# Patient Record
Sex: Male | Born: 1950 | Race: White | Hispanic: No | Marital: Married | Smoking: Former smoker
Health system: Southern US, Community
[De-identification: ages and names within clinical notes are randomized; demographics above are authoritative.]

## PROBLEM LIST (undated history)

## (undated) DIAGNOSIS — S8291XA Unspecified fracture of right lower leg, initial encounter for closed fracture: Secondary | ICD-10-CM

## (undated) DIAGNOSIS — C801 Malignant (primary) neoplasm, unspecified: Secondary | ICD-10-CM

## (undated) DIAGNOSIS — I5189 Other ill-defined heart diseases: Secondary | ICD-10-CM

## (undated) DIAGNOSIS — I10 Essential (primary) hypertension: Secondary | ICD-10-CM

## (undated) DIAGNOSIS — H9190 Unspecified hearing loss, unspecified ear: Secondary | ICD-10-CM

## (undated) DIAGNOSIS — G4733 Obstructive sleep apnea (adult) (pediatric): Secondary | ICD-10-CM

## (undated) DIAGNOSIS — C61 Malignant neoplasm of prostate: Secondary | ICD-10-CM

## (undated) DIAGNOSIS — R7301 Impaired fasting glucose: Secondary | ICD-10-CM

## (undated) DIAGNOSIS — I77810 Thoracic aortic ectasia: Secondary | ICD-10-CM

## (undated) DIAGNOSIS — Z8619 Personal history of other infectious and parasitic diseases: Secondary | ICD-10-CM

## (undated) DIAGNOSIS — Z85828 Personal history of other malignant neoplasm of skin: Secondary | ICD-10-CM

## (undated) DIAGNOSIS — R5383 Other fatigue: Secondary | ICD-10-CM

## (undated) DIAGNOSIS — N400 Enlarged prostate without lower urinary tract symptoms: Secondary | ICD-10-CM

## (undated) DIAGNOSIS — Z9289 Personal history of other medical treatment: Secondary | ICD-10-CM

## (undated) DIAGNOSIS — S2232XA Fracture of one rib, left side, initial encounter for closed fracture: Secondary | ICD-10-CM

## (undated) HISTORY — DX: Malignant neoplasm of prostate: C61

## (undated) HISTORY — DX: Unspecified fracture of right lower leg, initial encounter for closed fracture: S82.91XA

## (undated) HISTORY — DX: Other ill-defined heart diseases: I51.89

## (undated) HISTORY — DX: Personal history of other malignant neoplasm of skin: Z85.828

## (undated) HISTORY — DX: Personal history of other infectious and parasitic diseases: Z86.19

## (undated) HISTORY — PX: OTHER SURGICAL HISTORY: SHX169

## (undated) HISTORY — DX: Thoracic aortic ectasia: I77.810

## (undated) HISTORY — DX: Benign prostatic hyperplasia without lower urinary tract symptoms: N40.0

## (undated) HISTORY — DX: Unspecified hearing loss, unspecified ear: H91.90

## (undated) HISTORY — DX: Fracture of one rib, left side, initial encounter for closed fracture: S22.32XA

## (undated) HISTORY — DX: Obstructive sleep apnea (adult) (pediatric): G47.33

## (undated) HISTORY — DX: Other fatigue: R53.83

## (undated) HISTORY — DX: Personal history of other medical treatment: Z92.89

## (undated) HISTORY — PX: TONSILLECTOMY AND ADENOIDECTOMY: SUR1326

## (undated) HISTORY — DX: Malignant (primary) neoplasm, unspecified: C80.1

## (undated) HISTORY — DX: Impaired fasting glucose: R73.01

## (undated) HISTORY — DX: Essential (primary) hypertension: I10

---

## 2007-01-02 ENCOUNTER — Ambulatory Visit: Payer: Self-pay | Admitting: Internal Medicine

## 2007-01-14 ENCOUNTER — Ambulatory Visit: Payer: Self-pay | Admitting: Internal Medicine

## 2007-03-08 ENCOUNTER — Ambulatory Visit (HOSPITAL_COMMUNITY): Admission: RE | Admit: 2007-03-08 | Discharge: 2007-03-08 | Payer: Self-pay | Admitting: Urology

## 2007-03-19 ENCOUNTER — Encounter: Admission: RE | Admit: 2007-03-19 | Discharge: 2007-03-19 | Payer: Self-pay | Admitting: Urology

## 2011-01-08 ENCOUNTER — Encounter: Payer: Self-pay | Admitting: Otolaryngology

## 2012-01-12 ENCOUNTER — Encounter: Payer: Self-pay | Admitting: Internal Medicine

## 2012-09-23 ENCOUNTER — Encounter: Payer: Self-pay | Admitting: Internal Medicine

## 2013-06-18 ENCOUNTER — Other Ambulatory Visit: Payer: Self-pay | Admitting: Dermatology

## 2014-02-25 ENCOUNTER — Ambulatory Visit (INDEPENDENT_AMBULATORY_CARE_PROVIDER_SITE_OTHER): Payer: BC Managed Care – PPO

## 2014-02-25 VITALS — BP 112/75 | HR 84 | Resp 18

## 2014-02-25 DIAGNOSIS — G5760 Lesion of plantar nerve, unspecified lower limb: Secondary | ICD-10-CM

## 2014-02-25 DIAGNOSIS — M79609 Pain in unspecified limb: Secondary | ICD-10-CM

## 2014-02-25 DIAGNOSIS — M778 Other enthesopathies, not elsewhere classified: Secondary | ICD-10-CM

## 2014-02-25 DIAGNOSIS — M7750 Other enthesopathy of unspecified foot: Secondary | ICD-10-CM

## 2014-02-25 DIAGNOSIS — M779 Enthesopathy, unspecified: Secondary | ICD-10-CM

## 2014-02-25 DIAGNOSIS — B351 Tinea unguium: Secondary | ICD-10-CM

## 2014-02-25 MED ORDER — EFINACONAZOLE 10 % EX SOLN: CUTANEOUS | Status: DC

## 2014-02-25 NOTE — Patient Instructions (Signed)
ICE INSTRUCTIONS  Apply ice or cold pack to the affected area at least 3 times a day for 10-15 minutes each time.  You should also use ice after prolonged activity or vigorous exercise.  Do not apply ice longer than 20 minutes at one time.  Always keep a cloth between your skin and the ice pack to prevent burns.  Being consistent and following these instructions will help control your symptoms.  We suggest you purchase a gel ice pack because they are reusable and do bit leak.  Some of them are designed to wrap around the area.  Use the method that works best for you.  Here are some other suggestions for icing.   Use a frozen bag of peas or corn-inexpensive and molds well to your body, usually stays frozen for 10 to 20 minutes.  Wet a towel with cold water and squeeze out the excess until it's damp.  Place in a bag in the freezer for 20 minutes. Then remove and use.  Recommend wider looser lace up shoes for the next several weeks. For any severe pain tried Tylenol or ibuprofen over-the-counter as needed For walking activities start slow and maintain a level smooth surface avoid any holes inclines  For fungus nails apply the topical antifungal once daily to each affected toe one drop toe daily for 12 month

## 2014-02-25 NOTE — Progress Notes (Signed)
   Subjective:    Patient ID: Jeremiah Holt, male    DOB: 1951/02/09, 62 y.o.   MRN: [id number]  HPI there is a place on the ball of my left foot that has been going for about 3 weeks and feels like it ought not to be there and irritated    Review of Systems  Respiratory: Positive for cough.   Allergic/Immunologic: Positive for environmental allergies.  All other systems reviewed and are negative.       Objective:   Physical Exam Lower extremity objective findings as follows. Pedal pulses palpable DP and PT +2/4 bilateral capillary refill time 3 seconds all digits skin temperature warm turgor normal no edema rubor pallor or varicosities noted. Neurologically epicritic and proprioceptive sensations appear to be intact and symmetric bilateral. There is normal plantar response DTRs not listed. Dermatologically skin color pigment normal hair growth absent although diminished distally nail somewhat criptotic friable yellow hallux and second third digits show yellowing and discoloration and friability the nail plate consistent with onychomycosis. There is no interdigital fissuring no maceration noted. Orthopedic biomechanical exam reveals rectus foot type mild digital contractures noted adductovarus rotation lesser digits 4-5 bilateral hallux appears to be rectus good range of motion all digits is noted. There is no reproducible pain or symptomology however patient may have some slight abnormal sensation along the second third and fourth toes second third interspaces left foot. Patient describes a full type sensation or feeling as if you standing on something to it's not constant. No x-rays taken at this time no pain on vibratory tuning fork application no signs of fracture or any other osseous abnormalities no cyst or tumors  suspect at this time.       Assessment & Plan:  Assessment #1 early Morton's neuroma symptomology left foot second and third interspace area. And wider Oxford shoes and lace  up shoes avoid entire constrictive or curved last also suggested Tylenol or Advil as needed for pain and ice to the heat to the foot area once or twice every day or in the evening before going to bed. Also maintain level surfaces when the ballistic activities as returns to walking and exercising.  Assessment #2 onychomycosis of nails hallux second third digits bilateral show thickening dystrophy discoloration and friability patient was to avoid oral and medications do to a liver toxicity is in stable tried topical Jublia antifungal which is called in to allow the pharmacy will be mailed to him patient will initiate use for daily instruction is to apply 1 drop to the affected toes each day for 12 months duration. Suggest a 6-12 month followup for the nails if the neuroma symptomology has improved we'll followup with in the next month as needed  Harriet Masson DPM

## 2014-02-26 ENCOUNTER — Other Ambulatory Visit (HOSPITAL_COMMUNITY): Payer: Self-pay | Admitting: Internal Medicine

## 2014-02-26 ENCOUNTER — Ambulatory Visit (HOSPITAL_COMMUNITY)
Admission: RE | Admit: 2014-02-26 | Discharge: 2014-02-26 | Disposition: A | Discharge status: Home / Self Care | Payer: BC Managed Care – PPO | Source: Ambulatory Visit | Attending: Vascular Surgery | Admitting: Vascular Surgery

## 2014-02-26 DIAGNOSIS — F17200 Nicotine dependence, unspecified, uncomplicated: Secondary | ICD-10-CM

## 2014-03-05 ENCOUNTER — Other Ambulatory Visit (HOSPITAL_COMMUNITY): Payer: Self-pay

## 2014-06-16 ENCOUNTER — Encounter: Payer: Self-pay | Admitting: *Deleted

## 2014-06-17 ENCOUNTER — Encounter: Payer: Self-pay | Admitting: Neurology

## 2014-06-17 ENCOUNTER — Ambulatory Visit (INDEPENDENT_AMBULATORY_CARE_PROVIDER_SITE_OTHER): Payer: BC Managed Care – PPO | Admitting: Neurology

## 2014-06-17 VITALS — BP 119/73 | HR 88 | Resp 18 | Ht 70.5 in | Wt 216.0 lb

## 2014-06-17 DIAGNOSIS — Z9989 Dependence on other enabling machines and devices: Principal | ICD-10-CM

## 2014-06-17 DIAGNOSIS — R0609 Other forms of dyspnea: Secondary | ICD-10-CM

## 2014-06-17 DIAGNOSIS — R0989 Other specified symptoms and signs involving the circulatory and respiratory systems: Secondary | ICD-10-CM

## 2014-06-17 DIAGNOSIS — G4733 Obstructive sleep apnea (adult) (pediatric): Secondary | ICD-10-CM | POA: Insufficient documentation

## 2014-06-17 DIAGNOSIS — E663 Overweight: Secondary | ICD-10-CM

## 2014-06-17 DIAGNOSIS — R0683 Snoring: Secondary | ICD-10-CM

## 2014-06-17 NOTE — Patient Instructions (Signed)
Sleep Apnea  Sleep apnea is a sleep disorder characterized by abnormal pauses in breathing while you sleep. When your breathing pauses, the level of oxygen in your blood decreases. This causes you to move out of deep sleep and into light sleep. As a result, your quality of sleep is poor, and the system that carries your blood throughout your body (cardiovascular system) experiences stress. If sleep apnea remains untreated, the following conditions can develop:  High blood pressure (hypertension).  Coronary artery disease.  Inability to achieve or maintain an erection (impotence).  Impairment of your thought process (cognitive dysfunction). There are three types of sleep apnea: 1. Obstructive sleep apnea--Pauses in breathing during sleep because of a blocked airway. 2. Central sleep apnea--Pauses in breathing during sleep because the area of the brain that controls your breathing does not send the correct signals to the muscles that control breathing. 3. Mixed sleep apnea--A combination of both obstructive and central sleep apnea. RISK FACTORS The following risk factors can increase your risk of developing sleep apnea:  Being overweight.  Smoking.  Having narrow passages in your nose and throat.  Being of older age.  Being male.  Alcohol use.  Sedative and tranquilizer use.  Ethnicity. Among individuals younger than 35 years, African Americans are at increased risk of sleep apnea. SYMPTOMS   Difficulty staying asleep.  Daytime sleepiness and fatigue.  Loss of energy.  Irritability.  Loud, heavy snoring.  Morning headaches.  Trouble concentrating.  Forgetfulness.  Decreased interest in sex. DIAGNOSIS  In order to diagnose sleep apnea, your caregiver will perform a physical examination. Your caregiver may suggest that you take a home sleep test. Your caregiver may also recommend that you spend the night in a sleep lab. In the sleep lab, several monitors record  information about your heart, lungs, and brain while you sleep. Your leg and arm movements and blood oxygen level are also recorded. TREATMENT The following actions may help to resolve mild sleep apnea:  Sleeping on your side.   Using a decongestant if you have nasal congestion.   Avoiding the use of depressants, including alcohol, sedatives, and narcotics.   Losing weight and modifying your diet if you are overweight. There also are devices and treatments to help open your airway:  Oral appliances. These are custom-made mouthpieces that shift your lower jaw forward and slightly open your bite. This opens your airway.  Devices that create positive airway pressure. This positive pressure "splints" your airway open to help you breathe better during sleep. The following devices create positive airway pressure:  Continuous positive airway pressure (CPAP) device. The CPAP device creates a continuous level of air pressure with an air pump. The air is delivered to your airway through a mask while you sleep. This continuous pressure keeps your airway open.  Nasal expiratory positive airway pressure (EPAP) device. The EPAP device creates positive air pressure as you exhale. The device consists of single-use valves, which are inserted into each nostril and held in place by adhesive. The valves create very little resistance when you inhale but create much more resistance when you exhale. That increased resistance creates the positive airway pressure. This positive pressure while you exhale keeps your airway open, making it easier to breath when you inhale again.  Bilevel positive airway pressure (BPAP) device. The BPAP device is used mainly in patients with central sleep apnea. This device is similar to the CPAP device because it also uses an air pump to deliver continuous air pressure   through a mask. However, with the BPAP machine, the pressure is set at two different levels. The pressure when you  exhale is lower than the pressure when you inhale.  Surgery. Typically, surgery is only done if you cannot comply with less invasive treatments or if the less invasive treatments do not improve your condition. Surgery involves removing excess tissue in your airway to create a wider passage way. Document Released: 11/24/2002 Document Revised: 03/31/2013 Document Reviewed: 04/11/2012 ExitCare Patient Information 2015 [employer]. This information is not intended to replace advice given to you by your health care provider. Make sure you discuss any questions you have with your health care provider.  

## 2014-06-17 NOTE — Progress Notes (Signed)
[facility]  Provider:  Larey Seat, M D  Referring Provider: Marton Redwood, MD Primary Care Physician:  Marton Redwood, MD  Chief Complaint  Patient presents with  . New Evaluation    Room   . Sleep consult    HPI:  Jeremiah Holt is a 63 y.o. male , who is seen here as a referral from Dr. Brigitte Pulse for a re-evaluation of sleep apnea.   The patient was referred to a PSG at [facility] In 2013, was diagnosed with OSA and returned for a CPA titration. He was not satisfied with the test facility and the way he received results. He couldn't sleep in the lab and was fitted with a FFM which caused pressure marks.  He is here because of recent changes in fatigue and well being , just this March 2015 he became extremely fatigued, irritable and depressed.  He sees himself as energetic and was always very energetic at work until this spring. He continued to snore , his apnea remained untreated since diagnosis.  He has described spells of sleep choking, causing a fear of suffocation. His first time in October 2013 and now again, he had to ask his wife to hold him up, allowing him to breath deep enough to calm down.this also caused anxiety in other aspects of his life, his work productivity is declined, his children noted during vacation. Little things now make him anxious.   He would consider himself a habitual mouth breather. He has some allergic rhinitis. His wife reports snoring, when supine not in a recliner.    He goes to bed between 10.00 Pm and shortly after goes to sleep. He likes to listen to an audio book with headphones. He rises at 7 AM, spontaneously.  He ha no nocturia but wakes 2-4 times , variable duration. No headaches , no pain. He wakes in general not feeling refreshed or restored. If he takes a nap in the afternoon, after lunch  And he wakes much more refreshed, after 30-45 minutes. Naps do not affect his nocturnal sleep. He remembers complex dreams,  long, detailed, vivid. No dream acting out. No RLS, no pain.  He prefers prone sleep.   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    There is no family history of sleep apnea. His father was napping frequently.  Review of Systems: Out of a complete 14 system review, the patient complains of only the following symptoms, and all other reviewed systems are negative. The patient endorsed the Epworth sleepiness score at 10 points.  FSS 48, GDS 3 points.   History   Social History  . Marital Status: Married    Spouse Name: [name]    Number of Children: 2  . Years of Education: Grad [employer]   Occupational History  . Not on file.   Social History Main Topics  . Smoking status: Former Smoker    Types: Cigars  . Smokeless tobacco: Never Used  . Alcohol Use: Yes     Comment: 1-2 glasses of wine daily  . Drug Use: No  . Sexual Activity: Not on file   Other Topics Concern  . Not on file   Social History Narrative   Patient is married ([name]) and lives at home with his wife.   Patient has two adult children.   Patient has a Financial risk analyst.   Patient is right-handed.   Patient drinks three cups of coffee daily.  Family History  Problem Relation Age of Onset  . Lymphoma Father   . Hypertension Father   . Prostate cancer Father   . Congestive Heart Failure Mother   . Hypertension Mother     Past Medical History  Diagnosis Date  . Hearing loss   . Fatigue   . Hypertension   . Cancer     History reviewed. No pertinent past surgical history.  Current Outpatient Prescriptions  Medication Sig Dispense Refill  . aspirin 81 MG tablet Take 81 mg by mouth daily.      Marland Kitchen atorvastatin (LIPITOR) 40 MG tablet Take 40 mg by mouth daily.       . cetirizine (ZYRTEC) 10 MG tablet Take 10 mg by mouth at bedtime.      . GUAIFENESIN PO Take 1 tablet by mouth at bedtime. 800 mg      . Multiple Vitamin (MULTIVITAMIN) tablet Take 1 tablet by mouth  daily.      . Ranitidine HCl (ACID REDUCER PO) Take 1 tablet by mouth. Before meals      . valsartan-hydrochlorothiazide (DIOVAN-HCT) 160-25 MG per tablet        No current facility-administered medications for this visit.    Allergies as of 06/17/2014  . (No Known Allergies)    Vitals: BP 119/73  Pulse 88  Resp 18  Ht 5' 10.5" (1.791 m)  Wt 216 lb (97.977 kg)  BMI 30.54 kg/m2 Last Weight:  Wt Readings from Last 1 Encounters:  06/17/14 216 lb (97.977 kg)   Last Height:   Ht Readings from Last 1 Encounters:  06/17/14 5' 10.5" (1.791 m)     Physical exam:  General: The patient is awake, alert and appears not in acute distress. The patient is well groomed. Head: Normocephalic, atraumatic. Neck is supple. Mallampati 4 , neck circumference: 17.5 with a double chin.  Cardiovascular:  Regular rate and rhythm , without  murmurs or carotid bruit, and without distended neck veins. Respiratory: Lungs are clear to auscultation. Skin:  Without evidence of edema, or rash Trunk: BMI is  elevated and patient  has normal posture.  Neurologic exam : The patient is awake and alert, oriented to place and time.   Memory subjective described as intact. There is a normal attention span & concentration ability.  Speech is fluent without dysarthria, dysphonia or aphasia. Mood and affect are appropriate.  Cranial nerves: Pupils are equal and briskly reactive to light. Funduscopic exam without  evidence of pallor or edema.  Extraocular movements  in vertical and horizontal planes intact and without nystagmus. Visual fields by finger perimetry are intact. Hearing to finger rub intact.  Facial sensation intact to fine touch. Facial motor strength is symmetric and tongue and uvula move midline.  Motor exam:  Normal tone and normal muscle bulk and symmetric normal strength in all extremities.  Sensory:  Fine touch and vibration were tested in all extremities. Proprioception is   normal.  Coordination: Rapid alternating movements in the fingers/hands is tested and normal.  Finger-to-nose maneuver tested and normal without evidence of ataxia, dysmetria or tremor.  Gait and station: Patient walks without assistive device . Strength within normal limits. Stance is stable and normal. Steps are unfragmented.  Deep tendon reflexes: in the  upper and lower extremities are symmetric and intact. Babinski maneuver response is downgoing.   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  Plan:  Treatment plan and additional workup :  Patient will work on weight loss, sleeping prone. OSA - Degree at this point is unknown, recommend a PSG to estimate severity.

## 2014-07-28 ENCOUNTER — Ambulatory Visit: Payer: BC Managed Care – PPO | Admitting: Neurology

## 2014-07-28 DIAGNOSIS — F489 Nonpsychotic mental disorder, unspecified: Secondary | ICD-10-CM

## 2014-07-28 DIAGNOSIS — G4733 Obstructive sleep apnea (adult) (pediatric): Secondary | ICD-10-CM

## 2014-08-05 ENCOUNTER — Other Ambulatory Visit: Payer: Self-pay | Admitting: *Deleted

## 2014-08-05 ENCOUNTER — Telehealth: Payer: Self-pay | Admitting: Neurology

## 2014-08-05 ENCOUNTER — Encounter: Payer: Self-pay | Admitting: *Deleted

## 2014-08-05 DIAGNOSIS — Z9989 Dependence on other enabling machines and devices: Principal | ICD-10-CM

## 2014-08-05 DIAGNOSIS — R0683 Snoring: Secondary | ICD-10-CM

## 2014-08-05 DIAGNOSIS — G4733 Obstructive sleep apnea (adult) (pediatric): Secondary | ICD-10-CM

## 2014-08-05 DIAGNOSIS — E663 Overweight: Secondary | ICD-10-CM

## 2014-08-05 NOTE — Telephone Encounter (Signed)
I called and left a message for the patient about his recent sleep study results. I informed the patient that the study revealed severe obstructive sleep apnea and insomnia and he did well on CPAP during the night of his with significant improvement in his respiratory events. Dr. Brett Fairy recommend starting CPAP therapy at home. I will send the order to [facility], fax a copy of the report to Dr. Jacquelynn Cree office and mail a copy to the patient.

## 2014-09-11 ENCOUNTER — Encounter: Payer: Self-pay | Admitting: Neurology

## 2014-09-14 ENCOUNTER — Encounter: Payer: Self-pay | Admitting: Neurology

## 2014-10-02 ENCOUNTER — Encounter: Payer: Self-pay | Admitting: Cardiovascular Disease

## 2014-10-12 ENCOUNTER — Ambulatory Visit (INDEPENDENT_AMBULATORY_CARE_PROVIDER_SITE_OTHER): Payer: Medicare Other | Admitting: Cardiovascular Disease

## 2014-10-12 ENCOUNTER — Encounter: Payer: Self-pay | Admitting: Cardiovascular Disease

## 2014-10-12 VITALS — BP 124/72 | HR 75 | Ht 70.0 in | Wt 217.1 lb

## 2014-10-12 DIAGNOSIS — I10 Essential (primary) hypertension: Secondary | ICD-10-CM | POA: Diagnosis not present

## 2014-10-12 DIAGNOSIS — E785 Hyperlipidemia, unspecified: Secondary | ICD-10-CM | POA: Diagnosis not present

## 2014-10-12 NOTE — Patient Instructions (Addendum)
The dizziness after having while standing up is probably orthostatic hypotension.  All of the stress test that you had a [facility] look normal.   Your physician recommends that you continue on your current medications as directed. Please refer to the Current Medication list given to you today.  Your physician wants you to follow-up in: 1 year with Dr. Acie Fredrickson.  You will receive a reminder letter in the mail two months in advance. If you don't receive a letter, please call our office to schedule the follow-up appointment. Your physician recommends that you return for lab work in: 12 months on the day of or a few days before your office visit with Dr. Acie Fredrickson.  You will need to FAST for this appointment - nothing to eat or drink after midnight the night before except water.

## 2014-10-12 NOTE — Progress Notes (Signed)
Jeremiah Holt Date of Birth  12-Jun-1951       [facility]    [employer] [address], [place], [place]  [address]   [place], [place]  [address] [place]   Fax  [phone]     Fax [phone]  Problem List: 1. Hypertension 2. Dyslipidemia 3.  Prostate cancer-status post radiation therapy 4. Obstructive sleep apnea  History of Present Illness:  Jeremiah Holt is a 63 year old gentleman with a history of hypertension and dyslipidemia. He has a family history of coronary artery disease.  He is a previous patient of Dr. Elisabeth Cara.  He has had some orthostatinc hypotension recently.  These are especially common early in the AM.   He has also had these in the afternoon.  These episodes have improved slightly since being started on CPAP.    He has these quite frequently after he has been walking.    He's had a negative stress Myoview study in 2009 and again in 2010.  The negative plain treadmill 2007. An echocardiogram in March of 2012 revealed normal left ventricular systolic function. There is a suggestion of diastolic dysfunction. There was trace aortic regurgitation and tricuspid regurgitation.  His main concern is that his father had bad CAD.   He exercises regularly.  He wears his fit bit.  Lives also in [place].  He typically walks 10k-13K a day.    He is a  Chief Executive Officer.  Is able to work out of his home when he is on [place].     Current Outpatient Prescriptions on File Prior to Visit  Medication Sig Dispense Refill  . aspirin 81 MG tablet Take 81 mg by mouth daily.      Marland Kitchen atorvastatin (LIPITOR) 40 MG tablet Take 40 mg by mouth daily.       . cetirizine (ZYRTEC) 10 MG tablet Take 10 mg by mouth at bedtime.      . GUAIFENESIN PO Take 1 tablet by mouth at bedtime. 800 mg      . Multiple Vitamin (MULTIVITAMIN) tablet Take 1 tablet by mouth daily.      . Ranitidine HCl (ACID REDUCER PO) Take 1 tablet by mouth.  Before meals      . valsartan-hydrochlorothiazide (DIOVAN-HCT) 160-25 MG per tablet        No current facility-administered medications on file prior to visit.    No Known Allergies  Past Medical History  Diagnosis Date  . Hearing loss   . Fatigue   . Hypertension   . Cancer     No past surgical history on file.  History  Smoking status  . Former Smoker  . Types: Cigars  Smokeless tobacco  . Never Used    History  Alcohol Use  . Yes    Comment: 1-2 glasses of wine daily    Family History  Problem Relation Age of Onset  . Lymphoma Father   . Hypertension Father   . Prostate cancer Father   . Congestive Heart Failure Mother   . Hypertension Mother     Reviw of Systems:  Reviewed in the HPI.  All other systems are negative.  Physical Exam: Blood pressure 124/72, pulse 75, height 5\' 10"  (1.778 m), weight 217 lb 1.9 oz (98.485 kg). Wt Readings from Last 3 Encounters:  10/12/14 217 lb 1.9 oz (98.485 kg)  06/17/14 216 lb (97.977 kg)     General: Well developed,  well nourished, in no acute distress.  Head: Normocephalic, atraumatic, sclera non-icteric, mucus membranes are moist,   Neck: Supple. Carotids are 2 + without bruits. No JVD   Lungs: Clear   Heart: RR, normal S1S2  Abdomen: Soft, non-tender, non-distended with normal bowel sounds.  Msk:  Strength and tone are normal   Extremities: No clubbing or cyanosis. No edema.  Distal pedal pulses are 2+ and equal    Neuro: CN II - XII intact.  Alert and oriented X 3.   Psych:  Normal   ECG: 10/12/2014: Normal sinus rhythm at 75. He has no ST or T wave changes.  Assessment / Plan:

## 2014-10-12 NOTE — Assessment & Plan Note (Signed)
Jeremiah Holt is doing  well. His blood pressure is fairly well controlled on his current medications.  Continue the same medications.

## 2014-10-12 NOTE — Assessment & Plan Note (Addendum)
Lipids have been well controlled.  He has a family hx of CAD and is concerned about his prognosis. He has no symptoms of ischemia

## 2014-10-16 ENCOUNTER — Ambulatory Visit: Payer: BC Managed Care – PPO | Admitting: Neurology

## 2014-10-20 DIAGNOSIS — L244 Irritant contact dermatitis due to drugs in contact with skin: Secondary | ICD-10-CM | POA: Diagnosis not present

## 2014-10-26 ENCOUNTER — Ambulatory Visit (INDEPENDENT_AMBULATORY_CARE_PROVIDER_SITE_OTHER): Payer: Medicare Other | Admitting: Neurology

## 2014-10-26 ENCOUNTER — Encounter: Payer: Self-pay | Admitting: Neurology

## 2014-10-26 VITALS — BP 142/77 | HR 79 | Temp 98.6°F | Resp 14 | Ht 70.0 in | Wt 222.0 lb

## 2014-10-26 DIAGNOSIS — G4733 Obstructive sleep apnea (adult) (pediatric): Secondary | ICD-10-CM | POA: Diagnosis not present

## 2014-10-26 DIAGNOSIS — Z9989 Dependence on other enabling machines and devices: Principal | ICD-10-CM

## 2014-10-26 NOTE — Progress Notes (Signed)
[facility]  Provider:  Larey Seat, M D  Referring Provider: Marton Redwood, MD Primary Care Physician:  Marton Redwood, MD  Chief Complaint  Patient presents with  . RV Sleep cpap    Rm 10, alone    HPI:  Jeremiah Holt is a 63 y.o. male , who is seen here as a referral from Dr. Brigitte Pulse for a re-evaluation of sleep apnea.   The patient was referred to a PSG at [facility] heart and sleep In 2013, was diagnosed with OSA and returned for a CPA titration. He was not satisfied with the test facility and the way he received results. He couldn't sleep in the lab and was fitted with a FFM which caused pressure marks. He is here because of recent changes in fatigue and well being , just this March 2015 he became extremely fatigued, irritable and depressed. He sees himself as energetic and was always very energetic at work until this spring. He continued to snore , his apnea remained untreated since diagnosis.  He has described spells of sleep choking, causing a fear of suffocation. His first time in October 2013 and now again, he had to ask his wife to hold him up, allowing him to breath deep enough to calm down.this also caused anxiety in other aspects of his life, his work productivity is declined, his children noted during vacation. Little things now make him anxious.   He would consider himself a habitual mouth breather. He has some allergic rhinitis. His wife reports snoring, when supine not in a recliner.    He goes to bed between 10.00 Pm and shortly after goes to sleep. He likes to listen to an audio book with headphones. He rises at 7 AM, spontaneously.  He ha no nocturia but wakes 2-4 times , variable duration. No headaches , no pain. He wakes in general not feeling refreshed or restored. If he takes a nap in the afternoon, after lunch  And he wakes much more refreshed, after 30-45 minutes. Naps do not affect his nocturnal sleep. He remembers complex dreams, long, detailed,  vivid. No dream acting out. No RLS, no pain.  He prefers prone sleep. 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 There is no family history of sleep apnea. His father was napping frequently.   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  He has become a believer in CPAP use, after being highly sceptical the first month. Now, he stated : "I would pay for it". His sleep study was performed in August by [facility] protocol.  His residual AHI is 3.5/hr.  Hourly use,  8 hours 37 minutes, 97%.          Review of Systems: Out of a complete 14 system review, the patient complains of only the following symptoms, and all other reviewed systems are negative.  The patient endorsed the Epworth sleepiness score at 10 points. FSS 48, GDS 3 points.   Epworth now 1 point,  Fatigue 17 points, GDS 1 point.   History   Social History  . Marital Status: Married    Spouse Name: Jeremiah Holt    Number of Children: 2  . Years of Education: Grad [employer]   Occupational History  . Not on file.   Social History Main Topics  . Smoking status: Former Smoker    Types: Cigars  . Smokeless tobacco: Never Used  . Alcohol Use:  Yes     Comment: 1-2 glasses of wine daily  . Drug Use: No  . Sexual Activity: Not on file   Other Topics Concern  . Not on file   Social History Narrative   Patient is married (Jeremiah Holt) and lives at home with his wife.   Patient has two adult children.   Patient has a Financial risk analyst.   Patient is right-handed.   Patient drinks three cups of coffee daily.             Family History  Problem Relation Age of Onset  . Lymphoma Father   . Hypertension Father   . Prostate cancer Father   . Congestive Heart Failure Mother   . Hypertension Mother     Past Medical History  Diagnosis Date  . Hearing loss   . Fatigue   . Hypertension   . Cancer     History reviewed. No pertinent past surgical history.  Current  Outpatient Prescriptions  Medication Sig Dispense Refill  . aspirin 81 MG tablet Take 81 mg by mouth daily.    Marland Kitchen atorvastatin (LIPITOR) 40 MG tablet Take 40 mg by mouth daily.     . cetirizine (ZYRTEC) 10 MG tablet Take 10 mg by mouth at bedtime.    . GUAIFENESIN PO Take 1 tablet by mouth at bedtime. 800 mg    . Multiple Vitamin (MULTIVITAMIN) tablet Take 1 tablet by mouth daily.    Marland Kitchen omeprazole (PRILOSEC) 20 MG capsule Take 20 mg by mouth daily.    . valsartan-hydrochlorothiazide (DIOVAN-HCT) 160-25 MG per tablet 1 tablet daily.      No current facility-administered medications for this visit.    Allergies as of 10/26/2014 - Review Complete 10/26/2014  Allergen Reaction Noted  . Efudex [fluorouracil] Dermatitis 10/26/2014    Vitals: BP 142/77 mmHg  Pulse 79  Temp(Src) 98.6 F (37 C) (Oral)  Resp 14  Ht 5\' 10"  (1.778 m)  Wt 222 lb (100.699 kg)  BMI 31.85 kg/m2 Last Weight:  Wt Readings from Last 1 Encounters:  10/26/14 222 lb (100.699 kg)   Last Height:   Ht Readings from Last 1 Encounters:  10/26/14 5\' 10"  (1.778 m)     Physical exam:  General: The patient is awake, alert and appears not in acute distress. The patient is well groomed. Head: Normocephalic, atraumatic. Neck is supple. Mallampati 4 , neck circumference: 17.5 with a double chin.  Cardiovascular:  Regular rate and rhythm , without  murmurs or carotid bruit, and without distended neck veins. Respiratory: Lungs are clear to auscultation. Skin:  Without evidence of edema, or rash Trunk: BMI is  elevated and patient  has normal posture.  Neurologic exam : The patient is awake and alert, oriented to place and time.   Memory subjective described as intact. There is a normal attention span & concentration ability.  Speech is fluent without dysarthria, dysphonia or aphasia. Mood and affect are appropriate.  Cranial nerves:  Extraocular movements  in vertical and horizontal planes intact and without nystagmus.  Visual fields by finger perimetry are intact. Hearing to finger rub intact. Facial motor strength is symmetric and tongue and uvula move midline.  Finger-to-nose maneuver tested and normal without evidence of ataxia, dysmetria or tremor.  Gait and station: Patient walks without assistive device . Strength within normal limits. Stance is stable and normal. Steps are unfragmented.  Deep tendon reflexes: in the  upper and lower extremities are symmetric and intact. Babinski maneuver response is downgoing.  Assessment:  After physical and neurologic examination, review of laboratory studies, imaging, neurophysiology testing and pre-existing records, assessment is  15 minutes.  )  OSA  Plan:  Treatment plan and additional workup :   OSA - treated CPAP now , 6 cm water- nasal pillow, allowing supine sleep

## 2014-11-04 DIAGNOSIS — Z23 Encounter for immunization: Secondary | ICD-10-CM | POA: Diagnosis not present

## 2014-12-17 DIAGNOSIS — Z8546 Personal history of malignant neoplasm of prostate: Secondary | ICD-10-CM | POA: Diagnosis not present

## 2014-12-21 ENCOUNTER — Telehealth: Payer: Self-pay | Admitting: Neurology

## 2014-12-28 ENCOUNTER — Telehealth: Payer: Self-pay | Admitting: Neurology

## 2014-12-28 NOTE — Telephone Encounter (Signed)
Patient is calling regarding a request from [place] for authorization for CPAP supplies. Please call. Thank you.

## 2015-02-11 DIAGNOSIS — L821 Other seborrheic keratosis: Secondary | ICD-10-CM | POA: Diagnosis not present

## 2015-02-11 DIAGNOSIS — Z79899 Other long term (current) drug therapy: Secondary | ICD-10-CM | POA: Diagnosis not present

## 2015-02-11 DIAGNOSIS — B351 Tinea unguium: Secondary | ICD-10-CM | POA: Diagnosis not present

## 2015-02-11 DIAGNOSIS — L570 Actinic keratosis: Secondary | ICD-10-CM | POA: Diagnosis not present

## 2015-03-05 DIAGNOSIS — H25042 Posterior subcapsular polar age-related cataract, left eye: Secondary | ICD-10-CM | POA: Diagnosis not present

## 2015-03-05 DIAGNOSIS — H35372 Puckering of macula, left eye: Secondary | ICD-10-CM | POA: Diagnosis not present

## 2015-03-23 ENCOUNTER — Ambulatory Visit: Payer: Medicare Other

## 2015-03-24 ENCOUNTER — Encounter: Payer: Self-pay | Admitting: Podiatry

## 2015-03-24 ENCOUNTER — Ambulatory Visit (INDEPENDENT_AMBULATORY_CARE_PROVIDER_SITE_OTHER): Payer: Medicare Other | Admitting: Podiatry

## 2015-03-24 VITALS — BP 127/77 | HR 76 | Resp 12

## 2015-03-24 DIAGNOSIS — G5760 Lesion of plantar nerve, unspecified lower limb: Secondary | ICD-10-CM | POA: Diagnosis not present

## 2015-03-24 NOTE — Progress Notes (Signed)
   Subjective:    Patient ID: Jeremiah Holt, male    DOB: Jul 07, 1951, 64 y.o.   MRN: [id number]  HPI  N-SHARP PAIN, TINGLING L--B/L BOTTOM TOES /BALL OF THE FOOT D-4 WEEKS O-SUDDENLY C-WORSE DURING THE DAY A-PRESSURE, WALKING T-NONE  This patient presents again complaining of intermittent sharp pains in the forefoot area left more frequently than the right. The episodes last approximately 15 minutes and resolve occurring every 3-4 days. Patient is quite active and walks 4-5 miles a day he has changed his shoes and has used occasional Tylenol Advil for similar complaints at the recommendation of Dr. Blenda Mounts on the visit of 02/25/2014.  He denies any other areas of sharp pain other than his feet    Review of Systems  Skin: Positive for color change.  All other systems reviewed and are negative.  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 [place]    Objective:   Physical Exam   Orientated 3  Vascular: DP pulses 2/4 bilaterally PT pulses 2/4 bilaterally Capillary reflex immediate bilaterally  Neurological: Sensation to 10 g monofilament wire intact 5/5 bilaterally Vibratory sensation intact bilaterally Ankle reflex equal and reactive bilaterally Palpable tenderness in the third right intermetatarsal space causing discomfort without a palpable movable lesions There is no tenderness to palpation in the second right intermetatarsal space There is mild tenderness in the second and third left intermetatarsal spaces without any palpable lesions  Dermatological: The toenails are brittle yellow and hypertrophic  Musculoskeletal: Pes planus bilaterally There is no restriction ankle, subtalar, midtarsal joints bilaterally         Assessment & Plan:   Assessment: Neuroma-like symptoms second and third intermetatarsal spaces bilaterally  Plan: I discussed the results of my findings today with patient suggesting that 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 At this time patient is having occasional symptoms without having to reduce her changes activity.  I am recommending that he attach some metatarsal pads 2-4 to see if this relieves some of the symptoms. Dispensed felt metatarsal raise pads 2-4 4 Demonstrated proper positioning of pads in shoes Also made patient aware of the symptoms are progressive I would recommend alcohol injections  Reappoint at patient's request

## 2015-03-24 NOTE — Patient Instructions (Signed)
Morton's Neuroma Neuralgia (nerve pain) or neuroma (benign [non-cancerous] nerve tumor) may develop on any interdigital nerve. The interdigital nerves (nerves between digits) of the foot travel beneath and between the metatarsals (long bones of the fore foot) and pass the nerve endings to the toes. The third interdigital is a common place for a small neuroma to form called Morton's neuroma. Another nerve to be affected commonly is the fourth interdigital nerve. This would be in approximately in the area of the base or ball under the bottom of your fourth toe. This condition occurs more commonly in women and is usually on one side. It is usually first noticed by pain radiating (spreading) to the ball of the foot or to the toes. CAUSES The cause of interdigital neuralgia may be from low grade repetitive trauma (damage caused by an accident) as in activities causing a repeated pounding of the foot (running, jumping etc.). It is also caused by improper footwear or recent loss of the fatty padding on the bottom of the foot. TREATMENT  The condition often resolves (goes away) simply with decreasing activity if that is thought to be the cause. Proper shoes are beneficial. Orthotics (special foot support aids) such as a metatarsal bar are often beneficial. This condition usually responds to conservative therapy, however if surgery is necessary it usually brings complete relief. HOME CARE INSTRUCTIONS   Apply ice to the area of soreness for 15-20 minutes, 03-04 times per day, while awake for the first 2 days. Put ice in a plastic bag and place a towel between the bag of ice and your skin.  Only take over-the-counter or prescription medicines for pain, discomfort, or fever as directed by your caregiver. MAKE SURE YOU:   Understand these instructions.  Will watch your condition.  Will get help right away if you are not doing well or get worse. Document Released: 03/12/2001 Document Revised: 02/26/2012  Document Reviewed: 02/04/2014 [facility] Patient Information 2015 [place], [place]. This information is not intended to replace advice given to you by your health care provider. Make sure you discuss any questions you have with your health care provider.

## 2015-06-03 DIAGNOSIS — I10 Essential (primary) hypertension: Secondary | ICD-10-CM | POA: Diagnosis not present

## 2015-06-03 DIAGNOSIS — E785 Hyperlipidemia, unspecified: Secondary | ICD-10-CM | POA: Diagnosis not present

## 2015-06-03 DIAGNOSIS — Z0000 Encounter for general adult medical examination without abnormal findings: Secondary | ICD-10-CM | POA: Diagnosis not present

## 2015-06-03 DIAGNOSIS — R7301 Impaired fasting glucose: Secondary | ICD-10-CM | POA: Diagnosis not present

## 2015-06-09 DIAGNOSIS — Z1212 Encounter for screening for malignant neoplasm of rectum: Secondary | ICD-10-CM | POA: Diagnosis not present

## 2015-06-10 DIAGNOSIS — R7301 Impaired fasting glucose: Secondary | ICD-10-CM | POA: Diagnosis not present

## 2015-06-10 DIAGNOSIS — G5762 Lesion of plantar nerve, left lower limb: Secondary | ICD-10-CM | POA: Diagnosis not present

## 2015-06-10 DIAGNOSIS — Z23 Encounter for immunization: Secondary | ICD-10-CM | POA: Diagnosis not present

## 2015-06-10 DIAGNOSIS — G4733 Obstructive sleep apnea (adult) (pediatric): Secondary | ICD-10-CM | POA: Diagnosis not present

## 2015-06-10 DIAGNOSIS — Z0000 Encounter for general adult medical examination without abnormal findings: Secondary | ICD-10-CM | POA: Diagnosis not present

## 2015-06-10 DIAGNOSIS — G609 Hereditary and idiopathic neuropathy, unspecified: Secondary | ICD-10-CM | POA: Diagnosis not present

## 2015-06-10 DIAGNOSIS — E291 Testicular hypofunction: Secondary | ICD-10-CM | POA: Diagnosis not present

## 2015-06-10 DIAGNOSIS — I10 Essential (primary) hypertension: Secondary | ICD-10-CM | POA: Diagnosis not present

## 2015-06-10 DIAGNOSIS — K219 Gastro-esophageal reflux disease without esophagitis: Secondary | ICD-10-CM | POA: Diagnosis not present

## 2015-06-10 DIAGNOSIS — E785 Hyperlipidemia, unspecified: Secondary | ICD-10-CM | POA: Diagnosis not present

## 2015-07-02 DIAGNOSIS — M2022 Hallux rigidus, left foot: Secondary | ICD-10-CM | POA: Diagnosis not present

## 2015-07-28 ENCOUNTER — Telehealth: Payer: Self-pay | Admitting: Neurology

## 2015-07-28 ENCOUNTER — Other Ambulatory Visit: Payer: Self-pay

## 2015-07-28 DIAGNOSIS — R5383 Other fatigue: Secondary | ICD-10-CM

## 2015-07-28 NOTE — Telephone Encounter (Signed)
Spoke to pt, he is concerned that his cpap might need adjusting. He has been fatigued more than normal the past three weeks and doesn't wake up refreshed. I advised pt that I wold print his cpap therapy report off line and show it to Dr. Brett Fairy to see if she recommends a pressure change. She might ask for him to come in for an appt, of which, he is amenable to. I advised the pt that I would call him back.

## 2015-07-28 NOTE — Telephone Encounter (Signed)
Spoke to Dr. Brett Fairy, and she reviewed the cpap therapy report. She said that no adjustments are necessary, the cpap settings and compliance are optimal, osa may not be the cause of fatigue. She recommends an ONO to be done thru [facility].  I called and advised pt of this. He is agreeable to having an ONO done to check his oxygen saturations overnight. I explained that [facility] would contact him for set up. Pt verbalized understanding.

## 2015-07-28 NOTE — Telephone Encounter (Signed)
Please call patient regarding CPAP, 1st year it worked well but in recent weeks, fatigue has returned, should we adjust the settings? Equipment provider requires your permission

## 2015-08-13 ENCOUNTER — Telehealth: Payer: Self-pay | Admitting: Neurology

## 2015-08-13 NOTE — Telephone Encounter (Signed)
I called patient. The patient was given a home oxygen monitor, he wasn't sure whether to use it with or without CPAP, and dictated he is to use it with the CPAP to determine whether the CPAP is effective in controlling his symptoms.

## 2015-08-19 DIAGNOSIS — D1801 Hemangioma of skin and subcutaneous tissue: Secondary | ICD-10-CM | POA: Diagnosis not present

## 2015-08-19 DIAGNOSIS — L570 Actinic keratosis: Secondary | ICD-10-CM | POA: Diagnosis not present

## 2015-08-19 DIAGNOSIS — D225 Melanocytic nevi of trunk: Secondary | ICD-10-CM | POA: Diagnosis not present

## 2015-08-30 DIAGNOSIS — Z8546 Personal history of malignant neoplasm of prostate: Secondary | ICD-10-CM | POA: Diagnosis not present

## 2015-08-30 DIAGNOSIS — Z853 Personal history of malignant neoplasm of breast: Secondary | ICD-10-CM | POA: Diagnosis not present

## 2015-09-03 DIAGNOSIS — R159 Full incontinence of feces: Secondary | ICD-10-CM | POA: Diagnosis not present

## 2015-09-03 DIAGNOSIS — E291 Testicular hypofunction: Secondary | ICD-10-CM | POA: Diagnosis not present

## 2015-09-03 DIAGNOSIS — R5383 Other fatigue: Secondary | ICD-10-CM | POA: Diagnosis not present

## 2015-09-29 DIAGNOSIS — Z23 Encounter for immunization: Secondary | ICD-10-CM | POA: Diagnosis not present

## 2015-10-12 DIAGNOSIS — D123 Benign neoplasm of transverse colon: Secondary | ICD-10-CM | POA: Diagnosis not present

## 2015-10-12 DIAGNOSIS — D125 Benign neoplasm of sigmoid colon: Secondary | ICD-10-CM | POA: Diagnosis not present

## 2015-10-12 DIAGNOSIS — Z86010 Personal history of colonic polyps: Secondary | ICD-10-CM | POA: Diagnosis not present

## 2015-10-12 DIAGNOSIS — Z8546 Personal history of malignant neoplasm of prostate: Secondary | ICD-10-CM | POA: Diagnosis not present

## 2015-10-12 DIAGNOSIS — D122 Benign neoplasm of ascending colon: Secondary | ICD-10-CM | POA: Diagnosis not present

## 2015-10-12 DIAGNOSIS — K5730 Diverticulosis of large intestine without perforation or abscess without bleeding: Secondary | ICD-10-CM | POA: Diagnosis not present

## 2015-10-12 DIAGNOSIS — D124 Benign neoplasm of descending colon: Secondary | ICD-10-CM | POA: Diagnosis not present

## 2015-10-12 DIAGNOSIS — K641 Second degree hemorrhoids: Secondary | ICD-10-CM | POA: Diagnosis not present

## 2015-10-12 DIAGNOSIS — Z1211 Encounter for screening for malignant neoplasm of colon: Secondary | ICD-10-CM | POA: Diagnosis not present

## 2015-10-14 NOTE — Telephone Encounter (Signed)
Error

## 2015-10-22 ENCOUNTER — Telehealth: Payer: Self-pay | Admitting: Neurology

## 2015-10-22 NOTE — Telephone Encounter (Signed)
Pt called sts he had oxygen test on 10/20/15 with [facility] Jonni Sanger) and inquiring if Dr Brett Fairy will have results prior to his appt on 11/9. Please call and advise [phone]
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
## 2015-10-27 ENCOUNTER — Encounter: Payer: Self-pay | Admitting: Neurology

## 2015-10-27 ENCOUNTER — Ambulatory Visit (INDEPENDENT_AMBULATORY_CARE_PROVIDER_SITE_OTHER): Payer: Medicare Other | Admitting: Neurology

## 2015-10-27 VITALS — BP 132/78 | HR 88 | Resp 20 | Ht 71.0 in | Wt 220.0 lb

## 2015-10-27 DIAGNOSIS — G4733 Obstructive sleep apnea (adult) (pediatric): Secondary | ICD-10-CM | POA: Diagnosis not present

## 2015-10-27 DIAGNOSIS — Z9989 Dependence on other enabling machines and devices: Principal | ICD-10-CM

## 2015-10-27 NOTE — Progress Notes (Signed)
[facility]  Provider:  Larey Seat, M D  Referring Provider: Marton Redwood, MD Primary Care Physician:  Marton Redwood, MD  Chief Complaint  Patient presents with  . Follow-up    on cpap, ONO performed because of increased fatigue, rm 10, alone    HPI:  Jeremiah Holt is a 64 y.o. male , who is seen here as a referral from Dr. Brigitte Pulse for a re-evaluation of sleep apnea.   The patient was referred to a PSG at the [facility] in 2013, was diagnosed with OSA and returned for a CPA titration.  He was not satisfied with the test facility and the way he received results. He couldn't sleep in the lab and was fitted with a FFM which caused pressure marks. He is here because of recent changes in fatigue and well being , just this March 2015 he became extremely fatigued, irritable and depressed. He sees himself as energetic and was always very energetic at work until this spring. He continued to snore , his apnea remained untreated since diagnosis.  He has described spells of sleep choking, causing a fear of suffocation. His first time in October 2013 and now again, he had to ask his wife to hold him up, allowing him to breath deep enough to calm down.this also caused anxiety in other aspects of his life, his work productivity is declined, his children noted during vacation. Little things now make him anxious.  He would consider himself a habitual mouth breather. He has some allergic rhinitis. His wife reports snoring, when supine not in a recliner.   He goes to bed between 10.00 Pm and shortly after goes to sleep. He likes to listen to an audio book with headphones. He rises at 7 AM, spontaneously.  He ha no nocturia but wakes 2-4 times , variable duration. No headaches , no pain. He wakes in general not feeling refreshed or restored. If he takes a nap in the afternoon, after lunch  And he wakes much more refreshed, after 30-45 minutes. Naps do not affect his  nocturnal sleep. He remembers complex dreams, long, detailed, vivid. No dream acting out. No RLS, no pain.  He prefers prone sleep. 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 There is no family history of sleep apnea. His father was napping frequently.   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 Now, he stated : "I would pay for it". His sleep study was performed in August by [facility] protocol.  His residual AHI is 3.5/hr.  Hourly use,  8 hours 37 minutes, 97%.   10-27-15 Interval history -  The patient reports that for the first year that he used CPAP at the results were remarkable but for the last 2 months he has felt again fatigued. His compliance record shows that he uses the machine 100% of the time all days over 4 hours. Average user time 7 hours 1 minute, set pressure 6 cm water was 1 cm EPR and the residual AHI is 2.0. All these are very desirable results. There is no major air leak noted. He endorsed today the Epworth sleepiness score at 4 and the fatigue severity score at 40 2., the fatigue score clearly has risen. An overnight pulse oximetry have been obtained through his primary care physician showing that  the patient had a stable heart rate throughout the night and that he did not have significant or prolonged desaturations. The baseline oxygen saturation was 93.7% and the time less then 88% saturation was 1.2 minutes. He would not be qualifying for additional oxygen. He was able to reduce the blood pressure medicine dose by health. By end of August  he felt an increasing fatigue again and now takes a half hour nap each day , which refreshes him.    Review of Systems: Out of a complete 14 system review, the patient complains of only the following symptoms, and all other reviewed systems are negative.  Recurrent fatigue . The patient endorsed the  Epworth sleepiness score at 10 points. FSS 48, GDS 3 points.   Epworth now 4 from 1  point,  Fatigue  42 from 17 points, GDS  2 from 1 point.   Social History   Social History  . Marital Status: Married    Spouse Name: [name]  . Number of Children: 2  . Years of Education: [name]   Occupational History  . Not on file.   Social History Main Topics  . Smoking status: Former Smoker    Types: Cigars  . Smokeless tobacco: Never Used  . Alcohol Use: Yes     Comment: 1-2 glasses of wine daily  . Drug Use: No  . Sexual Activity: Not on file   Other Topics Concern  . Not on file   Social History Narrative   Patient is married ([name]) and lives at home with his wife.   Patient has two adult children.   Patient has a Financial risk analyst.   Patient is right-handed.   Patient drinks three cups of coffee daily.             Family History  Problem Relation Age of Onset  . Lymphoma Father   . Hypertension Father   . Prostate cancer Father   . Congestive Heart Failure Mother   . Hypertension Mother     Past Medical History  Diagnosis Date  . Hearing loss   . Fatigue   . Hypertension   . Cancer [facility])     History reviewed. No pertinent past surgical history.  Current Outpatient Prescriptions  Medication Sig Dispense Refill  . aspirin 81 MG tablet Take 81 mg by mouth daily.    Marland Kitchen atorvastatin (LIPITOR) 40 MG tablet Take 40 mg by mouth daily.     . cetirizine (ZYRTEC) 10 MG tablet Take 10 mg by mouth at bedtime.    . GUAIFENESIN PO Take 1 tablet by mouth at bedtime. 400 mg    . Multiple Vitamin (MULTIVITAMIN) tablet Take 1 tablet by mouth daily.    . valsartan-hydrochlorothiazide (DIOVAN-HCT) 80-12.5 MG tablet Take 1 tablet by mouth daily.     No current facility-administered medications for this visit.    Allergies as of 10/27/2015 - Review Complete 10/27/2015  Allergen Reaction Noted  . Efudex [fluorouracil] Dermatitis 10/26/2014    Vitals: BP 132/78 mmHg   Pulse 88  Resp 20  Ht 5\' 11"  (1.803 m)  Wt 220 lb (99.791 kg)  BMI 30.70 kg/m2 Last Weight:  Wt Readings from Last 1 Encounters:  10/27/15 220 lb (99.791 kg)   Last Height:   Ht Readings from Last 1 Encounters:  10/27/15 5\' 11"  (1.803 m)     Physical exam:  General: The patient is awake, alert and appears not in acute distress. The patient is well groomed. Head: Normocephalic,  atraumatic. Neck is supple. Mallampati 4 ,all natural teeth.   neck circumference: 18." with a double chin.  Cardiovascular:  Regular rate and rhythm , without  murmurs or carotid bruit, and without distended neck veins. Respiratory: Lungs are clear to auscultation. Skin:  Without evidence of edema, or rash, rosacea . Trunk: BMI is  elevated and patient  has normal posture.  Neurologic exam : The patient is awake and alert, oriented to place and time.   Memory subjective described as intact.  There is a normal attention span & concentration ability.  Speech is fluent. Mood and affect are appropriate.  Cranial nerves:  No change of smell or taste. Extraocular movements  in vertical and horizontal planes intact and without nystagmus. Visual fields by finger perimetry are intact. Hearing to finger rub intact. Facial motor strength is symmetric and tongue and uvula move midline. Finger-to-nose maneuver tested and normal without evidence of ataxia, dysmetria or tremor. Gait and station: Patient walks without assistive device, Strength within normal limits. Stance is stable and normal. Steps are unfragmented.  Deep tendon reflexes: in the  upper and lower extremities are 2/3 symmetric and intact. Babinski maneuver response is downgoing.   Assessment:  20 minute visit time with more than 50% of face to face tie dedicated to discussion of changes in lifestyle. I cannot related the surge in fatigue to any changes in his OSA treatment and the ONO did not show any desaturation. The patient may have responded to a  flu shot .     After physical and neurologic examination, review of laboratory studies, imaging, neurophysiology testing and pre-existing records, assessment is 78minutes.    Plan:  Treatment plan and additional workup :   OSA - treated CPAP now , 6 cm water- nasal pillow, allowing supine sleep. No o2 needed.  Allowed to take one 30 minute nap/ daily.     Kenslie Abbruzzese, MD   Cc Dr Brigitte Pulse,

## 2016-01-13 DIAGNOSIS — M7711 Lateral epicondylitis, right elbow: Secondary | ICD-10-CM | POA: Diagnosis not present

## 2016-02-18 DIAGNOSIS — E785 Hyperlipidemia, unspecified: Secondary | ICD-10-CM | POA: Diagnosis not present

## 2016-02-18 DIAGNOSIS — Z1159 Encounter for screening for other viral diseases: Secondary | ICD-10-CM | POA: Diagnosis not present

## 2016-02-18 DIAGNOSIS — I10 Essential (primary) hypertension: Secondary | ICD-10-CM | POA: Diagnosis not present

## 2016-02-21 DIAGNOSIS — Z1159 Encounter for screening for other viral diseases: Secondary | ICD-10-CM | POA: Diagnosis not present

## 2016-02-21 DIAGNOSIS — E669 Obesity, unspecified: Secondary | ICD-10-CM | POA: Diagnosis not present

## 2016-02-21 DIAGNOSIS — F419 Anxiety disorder, unspecified: Secondary | ICD-10-CM | POA: Diagnosis not present

## 2016-03-20 DIAGNOSIS — F428 Other obsessive-compulsive disorder: Secondary | ICD-10-CM | POA: Diagnosis not present

## 2016-03-20 DIAGNOSIS — I10 Essential (primary) hypertension: Secondary | ICD-10-CM | POA: Diagnosis not present

## 2016-03-20 DIAGNOSIS — E669 Obesity, unspecified: Secondary | ICD-10-CM | POA: Diagnosis not present

## 2016-03-27 DIAGNOSIS — R351 Nocturia: Secondary | ICD-10-CM | POA: Diagnosis not present

## 2016-03-27 DIAGNOSIS — Z8546 Personal history of malignant neoplasm of prostate: Secondary | ICD-10-CM | POA: Diagnosis not present

## 2016-03-27 DIAGNOSIS — R3915 Urgency of urination: Secondary | ICD-10-CM | POA: Diagnosis not present

## 2016-03-27 DIAGNOSIS — Z923 Personal history of irradiation: Secondary | ICD-10-CM | POA: Diagnosis not present

## 2016-03-27 DIAGNOSIS — R350 Frequency of micturition: Secondary | ICD-10-CM | POA: Diagnosis not present

## 2016-04-03 DIAGNOSIS — R350 Frequency of micturition: Secondary | ICD-10-CM | POA: Diagnosis not present

## 2016-04-03 DIAGNOSIS — R3915 Urgency of urination: Secondary | ICD-10-CM | POA: Diagnosis not present

## 2016-04-03 DIAGNOSIS — R300 Dysuria: Secondary | ICD-10-CM | POA: Diagnosis not present

## 2016-04-03 DIAGNOSIS — Z8546 Personal history of malignant neoplasm of prostate: Secondary | ICD-10-CM | POA: Diagnosis not present

## 2016-06-05 DIAGNOSIS — I10 Essential (primary) hypertension: Secondary | ICD-10-CM | POA: Diagnosis not present

## 2016-06-05 DIAGNOSIS — R7301 Impaired fasting glucose: Secondary | ICD-10-CM | POA: Diagnosis not present

## 2016-06-05 DIAGNOSIS — E784 Other hyperlipidemia: Secondary | ICD-10-CM | POA: Diagnosis not present

## 2016-06-07 DIAGNOSIS — L570 Actinic keratosis: Secondary | ICD-10-CM | POA: Diagnosis not present

## 2016-06-07 DIAGNOSIS — D225 Melanocytic nevi of trunk: Secondary | ICD-10-CM | POA: Diagnosis not present

## 2016-06-07 DIAGNOSIS — D1801 Hemangioma of skin and subcutaneous tissue: Secondary | ICD-10-CM | POA: Diagnosis not present

## 2016-06-12 DIAGNOSIS — Z6830 Body mass index (BMI) 30.0-30.9, adult: Secondary | ICD-10-CM | POA: Diagnosis not present

## 2016-06-12 DIAGNOSIS — G4733 Obstructive sleep apnea (adult) (pediatric): Secondary | ICD-10-CM | POA: Diagnosis not present

## 2016-06-12 DIAGNOSIS — R7301 Impaired fasting glucose: Secondary | ICD-10-CM | POA: Diagnosis not present

## 2016-06-12 DIAGNOSIS — I10 Essential (primary) hypertension: Secondary | ICD-10-CM | POA: Diagnosis not present

## 2016-06-12 DIAGNOSIS — E784 Other hyperlipidemia: Secondary | ICD-10-CM | POA: Diagnosis not present

## 2016-06-12 DIAGNOSIS — Z1389 Encounter for screening for other disorder: Secondary | ICD-10-CM | POA: Diagnosis not present

## 2016-06-12 DIAGNOSIS — C61 Malignant neoplasm of prostate: Secondary | ICD-10-CM | POA: Diagnosis not present

## 2016-06-12 DIAGNOSIS — G5762 Lesion of plantar nerve, left lower limb: Secondary | ICD-10-CM | POA: Diagnosis not present

## 2016-06-12 DIAGNOSIS — E668 Other obesity: Secondary | ICD-10-CM | POA: Diagnosis not present

## 2016-06-12 DIAGNOSIS — Z0000 Encounter for general adult medical examination without abnormal findings: Secondary | ICD-10-CM | POA: Diagnosis not present

## 2016-06-12 DIAGNOSIS — Z23 Encounter for immunization: Secondary | ICD-10-CM | POA: Diagnosis not present

## 2016-06-12 DIAGNOSIS — E291 Testicular hypofunction: Secondary | ICD-10-CM | POA: Diagnosis not present

## 2016-06-16 DIAGNOSIS — Z1212 Encounter for screening for malignant neoplasm of rectum: Secondary | ICD-10-CM | POA: Diagnosis not present

## 2016-09-15 DIAGNOSIS — Z8546 Personal history of malignant neoplasm of prostate: Secondary | ICD-10-CM | POA: Diagnosis not present

## 2016-09-15 DIAGNOSIS — Z923 Personal history of irradiation: Secondary | ICD-10-CM | POA: Diagnosis not present

## 2016-09-25 DIAGNOSIS — R351 Nocturia: Secondary | ICD-10-CM | POA: Diagnosis not present

## 2016-09-25 DIAGNOSIS — Z08 Encounter for follow-up examination after completed treatment for malignant neoplasm: Secondary | ICD-10-CM | POA: Diagnosis not present

## 2016-09-25 DIAGNOSIS — Z8546 Personal history of malignant neoplasm of prostate: Secondary | ICD-10-CM | POA: Diagnosis not present

## 2016-09-25 DIAGNOSIS — R369 Urethral discharge, unspecified: Secondary | ICD-10-CM | POA: Diagnosis not present

## 2016-10-03 DIAGNOSIS — F419 Anxiety disorder, unspecified: Secondary | ICD-10-CM | POA: Diagnosis not present

## 2016-10-03 DIAGNOSIS — Z23 Encounter for immunization: Secondary | ICD-10-CM | POA: Diagnosis not present

## 2016-10-03 DIAGNOSIS — G4733 Obstructive sleep apnea (adult) (pediatric): Secondary | ICD-10-CM | POA: Diagnosis not present

## 2016-10-03 DIAGNOSIS — I10 Essential (primary) hypertension: Secondary | ICD-10-CM | POA: Diagnosis not present

## 2016-10-03 DIAGNOSIS — R55 Syncope and collapse: Secondary | ICD-10-CM | POA: Diagnosis not present

## 2016-10-16 ENCOUNTER — Telehealth: Payer: Self-pay | Admitting: Cardiovascular Disease

## 2016-10-16 NOTE — Telephone Encounter (Signed)
Pt c/o Shortness Of Breath: STAT if SOB developed within the last 24 hours or pt is noticeably SOB on the phone  1. Are you currently SOB (can you hear that pt is SOB on the phone)? no 2. How long have you been experiencing SOB? Saturday 3. Are you SOB when sitting or when up moving around? Sitting 4.  Are you currently experiencing any other symptoms? Numbness in arms, sweating,lLightheadedness, and dizziness

## 2016-10-16 NOTE — Telephone Encounter (Signed)
Spoke with patient who states on Saturday he had and  episode of sweating profusely, weakness, SOB that lasted approximately 15 min and resolved on its own.  He denies chest pain at that time.  States he has not had another episode like that since that time.  He states his PCP in [facility] (where he spends half of his time) recently changed his BP medicine 3-4 weeks ago due to hypotension. Reports he passed out on one occasion. He previously took Valstartan/HCT 80/12.5 and was switched to Valsartan 80 mg.  He states since changing medication he has had no dizziness with change of position. . States home BP readings have been 150/75 mmHg and 135/73 mmHg.  Reports pulse in 60's bpm range usually.  Denies dyspnea or chest pain with exertion - states later on Saturday he helped his son move and felt fine.  He has not been seen in the office since 10/15.  I offered him an appointment with Dr. Acie Fredrickson for tomorrow but he has a commitment he cannot get out of.  He is scheduled to see Richardson Dopp, PA on Wed. 11/1.  I advised him to call back with worsening symptoms prior to that visit.  He verbalized understanding and agreement with plan.

## 2016-10-16 NOTE — Telephone Encounter (Signed)
Agree with having him come in for an office visit

## 2016-10-18 ENCOUNTER — Encounter: Payer: Self-pay | Admitting: Physician Assistant

## 2016-10-18 ENCOUNTER — Ambulatory Visit (INDEPENDENT_AMBULATORY_CARE_PROVIDER_SITE_OTHER): Payer: Medicare Other | Admitting: Physician Assistant

## 2016-10-18 ENCOUNTER — Encounter (INDEPENDENT_AMBULATORY_CARE_PROVIDER_SITE_OTHER): Payer: Self-pay

## 2016-10-18 VITALS — BP 140/60 | HR 65 | Ht 71.0 in | Wt 221.8 lb

## 2016-10-18 DIAGNOSIS — I4510 Unspecified right bundle-branch block: Secondary | ICD-10-CM | POA: Diagnosis not present

## 2016-10-18 DIAGNOSIS — R42 Dizziness and giddiness: Secondary | ICD-10-CM

## 2016-10-18 DIAGNOSIS — I951 Orthostatic hypotension: Secondary | ICD-10-CM

## 2016-10-18 DIAGNOSIS — E7800 Pure hypercholesterolemia, unspecified: Secondary | ICD-10-CM

## 2016-10-18 DIAGNOSIS — I10 Essential (primary) hypertension: Secondary | ICD-10-CM

## 2016-10-18 DIAGNOSIS — G4733 Obstructive sleep apnea (adult) (pediatric): Secondary | ICD-10-CM | POA: Diagnosis not present

## 2016-10-18 DIAGNOSIS — Z9989 Dependence on other enabling machines and devices: Secondary | ICD-10-CM

## 2016-10-18 LAB — CBC WITH DIFFERENTIAL/PLATELET
BASOS PCT: 1 %
Basophils Absolute: 51 cells/uL (ref 0–200)
EOS ABS: 102 {cells}/uL (ref 15–500)
EOS PCT: 2 %
HCT: 40.2 % (ref 38.5–50.0)
Hemoglobin: 13.7 g/dL (ref 13.2–17.1)
LYMPHS PCT: 27 %
Lymphs Abs: 1377 cells/uL (ref 850–3900)
MCH: 32.9 pg (ref 27.0–33.0)
MCHC: 34.1 g/dL (ref 32.0–36.0)
MCV: 96.4 fL (ref 80.0–100.0)
MONOS PCT: 9 %
MPV: 9.3 fL (ref 7.5–12.5)
Monocytes Absolute: 459 cells/uL (ref 200–950)
NEUTROS ABS: 3111 {cells}/uL (ref 1500–7800)
Neutrophils Relative %: 61 %
PLATELETS: 282 10*3/uL (ref 140–400)
RBC: 4.17 MIL/uL — AB (ref 4.20–5.80)
RDW: 13.5 % (ref 11.0–15.0)
WBC: 5.1 10*3/uL (ref 3.8–10.8)

## 2016-10-18 LAB — BASIC METABOLIC PANEL
BUN: 25 mg/dL (ref 7–25)
CALCIUM: 9.4 mg/dL (ref 8.6–10.3)
CHLORIDE: 104 mmol/L (ref 98–110)
CO2: 25 mmol/L (ref 20–31)
CREATININE: 1.3 mg/dL — AB (ref 0.70–1.25)
Glucose, Bld: 87 mg/dL (ref 65–99)
Potassium: 4.8 mmol/L (ref 3.5–5.3)
Sodium: 139 mmol/L (ref 135–146)

## 2016-10-18 MED ORDER — VALSARTAN 80 MG PO TABS: 80.0000 mg | ORAL_TABLET | Freq: Every day | ORAL | 3 refills | Status: DC

## 2016-10-18 NOTE — Progress Notes (Signed)
Cardiology Office Note:    Date:  10/18/2016   ID:  Jeremiah Holt, DOB [birth year], MRN [id number]  PCP:  Marton Redwood, MD  Cardiologist:  Dr. Liam Rogers   Electrophysiologist:  N/a Sleep Med: Dr. Brett Fairy  Referring MD: Marton Redwood, MD   Chief Complaint  Patient presents with  . 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  He lives part of the year in [place].  He was previously seen by Dr. Elisabeth Cara for Cardiology.  Last seen by Dr. Liam Rogers in 10/15.  Myoview was normal in 2009 and 2010.  Echo in 3/12 demonstrated normal LVF with suggestion of diastolic dysfunction.    He called in to the office on 10/30 with an episode of diaphoresis, weakness and dyspnea that lasted 15 minutes. His blood pressure medications were previously changed by his doctor in [place] secondary to an episode of syncope and low blood pressure. He was added on for further evaluation.    He is here alone today. While in [place] in September, he had an episode of near syncope. This occurred shortly after standing up. He was outside and it was very hot that day. He had had part of a martini to drink as well. He had several days of symptoms of orthostatic intolerance after this. He saw the primary care doctor that follows him in [place] who changed his Diovan HCT to Diovan 80 mg daily. He has felt well since that time. Of note, when he saw his primary care doctor here in [place] in June, his BUN was 32 and creatinine 1.4. Last week, he had an episode of dyspnea, profound diaphoresis and dizziness with associated right arm numbness that lasted about 15 minutes. He was sitting down for lunch at the time.  he had no chest pain.  After the symptoms resolved, he left to go home. He has felt well since that time. He denies frank syncope. He denies exertional chest discomfort or exertional shortness of breath. He  exercises on a daily basis without difficulty. He denies decreased exercise tolerance. He denies orthopnea, PND or edema.  Prior CV studies that were reviewed today include:    Aorta Duplex 3/15 No AAA  Echo 3/12 Mild concentric LVH, EF >55, impaired relaxation, trace TR, trace AI, borderline aortic root dilatation (3.9 cm)  Myoview 11/10 Exercised 12\' 15" , 13 METs, no ECG changes, no ischemia, EF 65, low risk  Past Medical History:  Diagnosis Date  . Cancer ([place])   . Fatigue   . Hearing loss   . Hypertension     History reviewed. No pertinent surgical history.  Current Medications: Current Meds  Medication Sig  . aspirin 81 MG tablet Take 81 mg by mouth daily.  Marland Kitchen atorvastatin (LIPITOR) 40 MG tablet Take 40 mg by mouth daily.   . cetirizine (ZYRTEC) 10 MG tablet Take 10 mg by mouth at bedtime.  . GUAIFENESIN PO Take 1 tablet by mouth at bedtime. 400 mg  . Multiple Vitamin (MULTIVITAMIN) tablet Take 1 tablet by mouth daily.  . valsartan (DIOVAN) 80 MG tablet Take 1 tablet (80 mg total) by mouth daily.  . [DISCONTINUED] valsartan (DIOVAN) 80 MG tablet Take 80 mg by mouth daily.     Allergies:   Efudex [fluorouracil]   Social History   Social History  . Marital status: Married  Spouse name: [name]  . Number of children: 2  . Years of education: [name]   Social History Main Topics  . Smoking status: Former Smoker    Types: Cigars  . Smokeless tobacco: Never Used  . Alcohol use Yes     Comment: 1-2 glasses of wine daily  . Drug use: No  . Sexual activity: Not Asked   Other Topics Concern  . None   Social History Narrative   Patient is married ([name]) and lives at home with his wife.   Patient has two adult children.   Patient has a Financial risk analyst.   Patient is right-handed.   Patient drinks three cups of coffee daily.     Family History:  The patient's family history includes Congestive Heart Failure in his mother; Heart attack (age of onset: 83) in  his father; Hypertension in his father and mother; Lymphoma in his father; Prostate cancer in his father.   ROS:   Please see the history of present illness.    Review of Systems  Constitution: Positive for diaphoresis.  Respiratory: Positive for shortness of breath.   Neurological: Positive for dizziness.   All other systems reviewed and are negative.   EKGs/Labs/Other Test Reviewed:    EKG:  EKG is  Ordered/reviewed personally today.  The ekg ordered today demonstrates NSR, HR 65, normal axis, low voltage, RBBB, QTc 440 ms.  When compared to prior tracings, there was a widened QRS in V1 previously. However, RBBB appears to be new.  Recent Labs: No results found for requested labs within last 8760 hours.  Labs from PCP 6/17: BUN 32, creatinine 1.4, ALT 19, Hgb 13.5, TC 142, TG 106, HDL 42, LDL 79  Recent Lipid Panel No results found for: CHOL, TRIG, HDL, CHOLHDL, VLDL, LDLCALC, LDLDIRECT   Physical Exam:    VS:  BP 140/60   Pulse 65   Ht 5\' 11"  (1.803 m)   Wt 221 lb 12.8 oz (100.6 kg)   BMI 30.93 kg/m     Wt Readings from Last 3 Encounters:  10/18/16 221 lb 12.8 oz (100.6 kg)  10/27/15 220 lb (99.8 kg)  10/26/14 222 lb (100.7 kg)     Physical Exam  Constitutional: He is oriented to person, place, and time. He appears well-developed and well-nourished. No distress.  HENT:  Head: Normocephalic and atraumatic.  Eyes: No scleral icterus.  Neck: No JVD present. Carotid bruit is not present. No thyromegaly present.  Cardiovascular: Normal rate, regular rhythm and normal heart sounds.   No murmur heard. Pulses:      Dorsalis pedis pulses are 2+ on the right side, and 2+ on the left side.       Posterior tibial pulses are 2+ on the right side, and 2+ on the left side.  Pulmonary/Chest: Effort normal. He has no wheezes. He has no rales.  Abdominal: Soft. He exhibits no mass. There is no tenderness.  Musculoskeletal: He exhibits no edema.  Neurological: He is alert and  oriented to person, place, and time.  Skin: Skin is warm and dry.  Psychiatric: He has a normal mood and affect.    ASSESSMENT:    1. Dizziness   2. Essential hypertension   3. Pure hypercholesterolemia   4. RBBB   5. OSA on CPAP   6. Orthostatic hypotension    PLAN:    In order of problems listed above:  1. Dizziness - He had an episode of dizziness, diaphoresis that lasted ~ 15  minutes last week.  He has a RBBB on his ECG which appears new.  I compared his ECG to several prior ECGs.  His QRS in V1 has been wider in the past but not c/w RBBB.  He did not have chest pain.  Given his RBBB I am concerned about a bradyarrhythmia as the cause of his symptoms.  However, I also question if this could have been ischemia mediated.  He has been able to exercise without difficulty and has not had chest pain. He has not had frank syncope.    -  Arrange echocardiogram   -  Arrange ETT-Myoview  -  Arrange 30 day event monitor  -  BMET, CBC today.  -  Close FU ~ 1 month.  2. HTN - BP has been close to target since changing his medications in [place] in 9/17.  He had one reading that was high.  His BUN and Creatinine were previously elevated prior to eliminating the HCTZ  -  Continue current regimen  -  Continue to monitor  -  BMET today  3. HL - Continue statin.  4. RBBB - This is new. Question progression of conduction system disease vs ischemia.  However, he has had no chest pain or decreased exercise tolerance.  Will proceed with stress testing, echo, event monitor as noted.  5. OSA - Continue CPAP  6. Orthostatic hypotension - He clearly had an episode of orthostasis in 9/17 prior to adjusting his medications.  He did not have frank syncope.  He has not had any further symptoms like this since that time.  Continue to hydrate and stand up slowly.  Remain off of HCTZ for now.  Medication Adjustments/Labs and Tests Ordered: Current medicines are reviewed at length with the patient today.   Concerns regarding medicines are outlined above.  Medication changes, Labs and Tests ordered today are outlined in the Patient Instructions noted below. Patient Instructions  Medication Instructions:  Your physician recommends that you continue on your current medications as directed. Please refer to the Current Medication list given to you today.  Labwork: 1. TODAY BMET, CBC W/DIFF  Testing/Procedures: 1. Your physician has requested that you have an echocardiogram. Echocardiography is a painless test that uses sound waves to create images of your heart. It provides your doctor with information about the size and shape of your heart and how well your heart's chambers and valves are working. This procedure takes approximately one hour. There are no restrictions for this procedure. THIS NEEDS TO BEFORE YOU LEAVE FOR YOUR TRIP  2. Your physician has requested that you have en exercise stress myoview. For further information please visit HugeFiesta.[email or web address]. Please follow instruction sheet, as given. THIS NEEDS TO BEFORE YOU LEAVE FOR YOUR TRIP  3. Your physician has recommended that you wear an event monitor. Event monitors are medical devices that record the heart's electrical activity. Doctors most often [place] these monitors to diagnose arrhythmias. Arrhythmias are problems with the speed or rhythm of the heartbeat. The monitor is a small, portable device. You can wear one while you do your normal daily activities. This is usually used to diagnose what is causing palpitations/syncope (passing out). THIS NEEDS TO BEFORE YOU LEAVE FOR YOUR TRIP  Follow-Up: DR. Acie Fredrickson IN MID December; PLEASE CALL THE OFFICE WHEN YOU ARE BACK IN TOWN SCHEDULE; IF DR. NAHSER IS NOT AVAILABLE THEN YOU CAN Arlissa Monteverde, PAC SAME DAY DR. Acie Fredrickson IS IN THE OFFICE  Any Other Special Instructions Will Be Listed  Below (If Applicable).  If you need a refill on your cardiac medications before your next appointment, please call  your pharmacy.  Signed, Richardson Dopp, PA-C  10/18/2016 1:35 PM    [facility] [place], [place], [place]  [address] Phone: [phone]; Fax: [phone]

## 2016-10-18 NOTE — Patient Instructions (Addendum)
Medication Instructions:  Your physician recommends that you continue on your current medications as directed. Please refer to the Current Medication list given to you today.  Labwork: 1. TODAY BMET, CBC W/DIFF  Testing/Procedures: 1. Your physician has requested that you have an echocardiogram. Echocardiography is a painless test that uses sound waves to create images of your heart. It provides your doctor with information about the size and shape of your heart and how well your heart's chambers and valves are working. This procedure takes approximately one hour. There are no restrictions for this procedure. THIS NEEDS TO BEFORE YOU LEAVE FOR YOUR TRIP  2. Your physician has requested that you have en exercise stress myoview. For further information please visit HugeFiesta.[email or web address]. Please follow instruction sheet, as given. THIS NEEDS TO BEFORE YOU LEAVE FOR YOUR TRIP  3. Your physician has recommended that you wear an event monitor. Event monitors are medical devices that record the heart's electrical activity. Doctors most often [place] these monitors to diagnose arrhythmias. Arrhythmias are problems with the speed or rhythm of the heartbeat. The monitor is a small, portable device. You can wear one while you do your normal daily activities. This is usually used to diagnose what is causing palpitations/syncope (passing out). THIS NEEDS TO BEFORE YOU LEAVE FOR YOUR TRIP  Follow-Up: DR. Acie Fredrickson IN MID December; PLEASE CALL THE OFFICE WHEN YOU ARE BACK IN TOWN SCHEDULE; IF DR. NAHSER IS NOT AVAILABLE THEN YOU CAN SCOTT WEAVER, PAC SAME DAY DR. Acie Fredrickson IS IN THE OFFICE  Any Other Special Instructions Will Be Listed Below (If Applicable).  If you need a refill on your cardiac medications before your next appointment, please call your pharmacy.

## 2016-10-19 ENCOUNTER — Other Ambulatory Visit: Payer: Self-pay

## 2016-10-19 ENCOUNTER — Ambulatory Visit (HOSPITAL_COMMUNITY): Payer: Medicare Other | Attending: Physician Assistant

## 2016-10-19 DIAGNOSIS — G4733 Obstructive sleep apnea (adult) (pediatric): Secondary | ICD-10-CM | POA: Diagnosis not present

## 2016-10-19 DIAGNOSIS — Z87891 Personal history of nicotine dependence: Secondary | ICD-10-CM | POA: Insufficient documentation

## 2016-10-19 DIAGNOSIS — R42 Dizziness and giddiness: Secondary | ICD-10-CM | POA: Insufficient documentation

## 2016-10-19 DIAGNOSIS — Z8249 Family history of ischemic heart disease and other diseases of the circulatory system: Secondary | ICD-10-CM | POA: Insufficient documentation

## 2016-10-19 DIAGNOSIS — I4510 Unspecified right bundle-branch block: Secondary | ICD-10-CM | POA: Insufficient documentation

## 2016-10-19 DIAGNOSIS — E785 Hyperlipidemia, unspecified: Secondary | ICD-10-CM | POA: Insufficient documentation

## 2016-10-19 DIAGNOSIS — I10 Essential (primary) hypertension: Secondary | ICD-10-CM | POA: Diagnosis not present

## 2016-10-20 ENCOUNTER — Telehealth: Payer: Self-pay | Admitting: Physician Assistant

## 2016-10-20 ENCOUNTER — Telehealth: Payer: Self-pay | Admitting: *Deleted

## 2016-10-20 ENCOUNTER — Encounter: Payer: Self-pay | Admitting: Physician Assistant

## 2016-10-20 NOTE — Telephone Encounter (Signed)
Pt states he just s/w Christine in our office and she went over his lab results with him. Pt said thank you .

## 2016-10-20 NOTE — Telephone Encounter (Signed)
PT AWARE OF LAB RESULTS./CY 

## 2016-10-20 NOTE — Telephone Encounter (Signed)
Lmtcb to go over echo results.  

## 2016-10-20 NOTE — Telephone Encounter (Signed)
Ptcb and has been notified of Echo results and findings by phone with verbal understanding.

## 2016-10-20 NOTE — Telephone Encounter (Signed)
Follow Up:; ° ° °Returning your call. °

## 2016-10-23 ENCOUNTER — Encounter: Payer: Self-pay | Admitting: Physician Assistant

## 2016-10-24 ENCOUNTER — Telehealth (HOSPITAL_COMMUNITY): Payer: Self-pay | Admitting: *Deleted

## 2016-10-24 ENCOUNTER — Encounter: Payer: Self-pay | Admitting: Neurology

## 2016-10-24 NOTE — Telephone Encounter (Signed)
Patient's wife, per DPR, given detailed instructions per Myocardial Perfusion Study Information Sheet for the test on 10/26/16. Patient notified to arrive 15 minutes early and that it is imperative to arrive on time for appointment to keep from having the test rescheduled.  If you need to cancel or reschedule your appointment, please call the office within 24 hours of your appointment. Failure to do so may result in a cancellation of your appointment, and a $50 no show fee. Patient verbalized understanding. Hubbard Robinson, RN

## 2016-10-26 ENCOUNTER — Encounter: Payer: Self-pay | Admitting: Neurology

## 2016-10-26 ENCOUNTER — Ambulatory Visit (INDEPENDENT_AMBULATORY_CARE_PROVIDER_SITE_OTHER): Payer: Medicare Other | Admitting: Neurology

## 2016-10-26 VITALS — BP 138/78 | HR 62 | Resp 20 | Ht 71.0 in | Wt 225.0 lb

## 2016-10-26 DIAGNOSIS — G4733 Obstructive sleep apnea (adult) (pediatric): Secondary | ICD-10-CM | POA: Diagnosis not present

## 2016-10-26 NOTE — Progress Notes (Addendum)
Referral as a transfer of care from [facility] and the order for travel-sized cpap sent to [facility] per pt and Dr. Brett Fairy request. Received a receipt of confirmation.

## 2016-10-26 NOTE — Progress Notes (Signed)
[facility]  Provider:  Larey Holt, M D  Referring Provider: Marton Redwood, MD Primary Care Physician:  Jeremiah Redwood, MD  Chief Complaint  Patient presents with  . Follow-up    cpap, wants a smaller cpap    HPI:  Jeremiah Holt is a 65 y.o. male , who is seen here as a referral from Dr. Brigitte Holt for a re-evaluation of sleep apnea.   The patient was referred to a PSG at the [facility] in 2013, was diagnosed with OSA and returned for a CPA titration.  He was not satisfied with the test facility and the way he received results. He couldn't sleep in the lab and was fitted with a FFM which caused pressure marks. He is here because of recent changes in fatigue and well being , just this March 2015 he became extremely fatigued, irritable and depressed. He sees himself as energetic and was always very energetic at work until this spring. He continued to snore , his apnea remained untreated since diagnosis.  He has described spells of sleep choking, causing a fear of suffocation. His first time in October 2013 and now again, he had to ask his wife to hold him up, allowing him to breath deep enough to calm down.this also caused anxiety in other aspects of his life, his work productivity is declined, his children noted during vacation. Little things now make him anxious.  He would consider himself a habitual mouth breather. He has some allergic rhinitis. His wife reports snoring, when supine not in a recliner.   He goes to bed between 10.00 PM and shortly after goes to sleep. He likes to listen to an audio book with headphones. He rises at 7 AM, spontaneously.  He ha no nocturia but wakes 2-4 times , variable duration. No headaches , no pain. He wakes in general not feeling refreshed or restored. If he takes a nap in the afternoon, after lunch  And he wakes much more refreshed, after 30-45 minutes. Naps do not affect his nocturnal sleep. He remembers complex dreams,  long, detailed, vivid. No dream acting out. No RLS, no pain.  He prefers prone sleep. 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 There is no family history of sleep apnea. His father was napping frequently.  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 He has become a believer in CPAP use, after being highly sceptical the first month. Now, he stated : "I would pay for it". His sleep study was performed in August by [facility] protocol.  His residual AHI is 3.5/hr.  Hourly use, 8 hours 37 minutes, 97%.   10-27-15 Interval history -  The patient reports that for the first year that he used CPAP at the results were remarkable but for the last 2 months he has felt again fatigued. His compliance record shows that he uses the machine 100% of the time all days over 4 hours. Average user time 7 hours 1 minute, set pressure 6 cm water was 1 cm EPR and the residual AHI is 2.0. All these are very desirable results. There is no major air leak noted. He endorsed today the Epworth sleepiness score at 4 and the fatigue severity score at 40 2., the fatigue score clearly has risen. An overnight Holt oximetry have been obtained through his primary care physician showing that the patient had a stable heart rate throughout the  night and that he did not have significant or prolonged desaturations. The baseline oxygen saturation was 93.7% and the time less then 88% saturation was 1.2 minutes. He would not be qualifying for additional oxygen. He was able to reduce the blood pressure medicine dose by health. By end of August  he felt an increasing fatigue again and now takes a half hour nap each day , which refreshes him.  10-26-2016  Spends more time in [facility], teaches at [employer], and keeps busy. Mr. Jeremiah Holt still travels a lot but mostly now within the [place]. He would like a trouble friendly smaller CPAP machine that allows packing an overnight bag. He has been 100%  compliant CPAP user has used the machine for 4 consecutive hours on 29 of the last 30 days, average user time 6 hours 22 minutes CPAP still set at 6 cm water was 1 cm EPR and residual apnea is 2.6. He uses the machine before midnight. No fatigue.  Nasal pillow.    Review of Systems: Out of a complete 14 system review, the patient complains of only the following symptoms, and all other reviewed systems are negative.  Recurrent fatigue . The patient endorsed the Epworth sleepiness score at 10 points. FSS 48, GDS 3 points.   Epworth now 4 from 1  point,  Fatigue  42 from 17 points, GDS  2 from 1 point.   Social History   Social History  . Marital status: Married    Spouse name: Jeremiah Holt  . Number of children: 2  . Years of education: [name]   Occupational History  . Not on file.   Social History Main Topics  . Smoking status: Former Smoker    Types: Cigars  . Smokeless tobacco: Never Used  . Alcohol use Yes     Comment: 1-2 glasses of wine daily  . Drug use: No  . Sexual activity: Not on file   Other Topics Concern  . Not on file   Social History Narrative   Patient is married (Jeremiah Holt) and lives at home with his wife.   Patient has two adult children.   Patient has a Financial risk analyst.   Patient is right-handed.   Patient drinks three cups of coffee daily.    Family History  Problem Relation Age of Onset  . Lymphoma Father   . Hypertension Father   . Prostate cancer Father   . Heart attack Father 38  . Congestive Heart Failure Mother   . Hypertension Mother     Past Medical History:  Diagnosis Date  . Cancer ([place])   . Fatigue   . Hearing loss   . History of echocardiogram    Echo 11/17: EF 55-60, normal wall motion, Gr 1 diastolic dysfunction, mildly dilated aortic root and ascending aorta (Ao root 40/ascending aorta 38), MAC  . Hypertension     No past surgical history on file.  Current Outpatient Prescriptions  Medication Sig Dispense Refill  .  aspirin 81 MG tablet Take 81 mg by mouth daily.    Marland Kitchen atorvastatin (LIPITOR) 40 MG tablet Take 40 mg by mouth daily.     . cetirizine (ZYRTEC) 10 MG tablet Take 10 mg by mouth at bedtime.    . GUAIFENESIN PO Take 1 tablet by mouth at bedtime. 400 mg    . Multiple Vitamin (MULTIVITAMIN) tablet Take 1 tablet by mouth daily.    . valsartan (DIOVAN) 80 MG tablet Take 1 tablet (80 mg total) by mouth daily.  90 tablet 3   No current facility-administered medications for this visit.     Allergies as of 10/26/2016 - Review Complete 10/26/2016  Allergen Reaction Noted  . Efudex [fluorouracil] Dermatitis 10/26/2014    Vitals: BP 138/78   Holt 62   Resp 20   Ht 5\' 11"  (1.803 m)   Wt 225 lb (102.1 kg)   BMI 31.38 kg/m  Last Weight:  Wt Readings from Last 1 Encounters:  10/26/16 225 lb (102.1 kg)   Last Height:   Ht Readings from Last 1 Encounters:  10/26/16 5\' 11"  (1.803 m)     Physical exam:  General: The patient is awake, alert and appears not in acute distress. The patient is well groomed. Head: Normocephalic, atraumatic. Neck is supple. Mallampati 4 ,all natural teeth.   neck circumference: 18." with a double chin.  Cardiovascular:  Regular rate and rhythm , without  murmurs or carotid bruit, and without distended neck veins. Respiratory: Lungs are clear to auscultation. Skin:  Without evidence of edema, or rash, rosacea . Trunk: BMI is  elevated and patient  has normal posture.  Neurologic exam : The patient is awake and alert, oriented to place and time.   Memory subjective described as intact.  There is a normal attention span & concentration ability.  Cranial nerves:No change of smell or taste. Extraocular movements  in vertical and horizontal planes intact and without nystagmus. Visual fields by finger perimetry are intact. Hearing to finger rub intact. Facial motor strength is symmetric and tongue and uvula move midline. Finger-to-nose maneuver tested and normal  without evidence of ataxia, dysmetria or tremor.  Assessment:  20 minute visit time with more than 50% of face to face tie dedicated to discussion of changes in lifestyle. I cannot related the surge in fatigue to any changes in his OSA treatment and the ONO did not show any desaturation. The patient may have responded to a flu shot.    After physical and neurologic examination, review of laboratory studies, imaging, neurophysiology testing and pre-existing records, assessment is 39minutes.    Plan:  Treatment plan and additional workup :   OSA - treated CPAP now , 6 cm water- nasal pillow, allowing supine sleep.  Allowed to take one 30 minute nap/ daily.   [facility] patient, not longer happy with the care . Unable to get through phone care, wants to change- suggested [facility]  I send an order to [facility] to issue a small travel CPAP to be set at 6 cm water.    Jacqulynn Shappell, MD  Cc Dr Jeremiah Holt,

## 2016-10-27 ENCOUNTER — Ambulatory Visit (HOSPITAL_COMMUNITY): Payer: Medicare Other | Attending: Cardiovascular Disease

## 2016-10-27 ENCOUNTER — Ambulatory Visit (INDEPENDENT_AMBULATORY_CARE_PROVIDER_SITE_OTHER): Payer: Medicare Other

## 2016-10-27 DIAGNOSIS — R42 Dizziness and giddiness: Secondary | ICD-10-CM | POA: Insufficient documentation

## 2016-10-27 DIAGNOSIS — I10 Essential (primary) hypertension: Secondary | ICD-10-CM | POA: Diagnosis not present

## 2016-10-27 DIAGNOSIS — I4510 Unspecified right bundle-branch block: Secondary | ICD-10-CM | POA: Insufficient documentation

## 2016-10-27 DIAGNOSIS — R0602 Shortness of breath: Secondary | ICD-10-CM | POA: Insufficient documentation

## 2016-10-27 LAB — MYOCARDIAL PERFUSION IMAGING
CHL CUP NUCLEAR SSS: 5
CHL CUP RESTING HR STRESS: 66 {beats}/min
CSEPED: 9 min
CSEPEDS: 0 s
CSEPEW: 10.1 METS
LV sys vol: 38 mL
LVDIAVOL: 97 mL (ref 62–150)
MPHR: 153 {beats}/min
NUC STRESS TID: 0.97
Peak HR: 134 {beats}/min
Percent HR: 87 %
RATE: 0.36
SDS: 2
SRS: 3

## 2016-10-27 MED ORDER — TECHNETIUM TC 99M TETROFOSMIN IV KIT
30.2000 | PACK | Freq: Once | INTRAVENOUS | Status: AC | PRN
  Administered 2016-10-27: 30.2 via INTRAVENOUS
  Filled 2016-10-27: qty 31

## 2016-10-27 MED ORDER — TECHNETIUM TC 99M TETROFOSMIN IV KIT
10.5000 | PACK | Freq: Once | INTRAVENOUS | Status: AC | PRN
  Administered 2016-10-27: 10.5 via INTRAVENOUS
  Filled 2016-10-27: qty 11

## 2016-10-29 ENCOUNTER — Encounter: Payer: Self-pay | Admitting: Physician Assistant

## 2016-10-30 ENCOUNTER — Telehealth: Payer: Self-pay | Admitting: *Deleted

## 2016-10-30 NOTE — Telephone Encounter (Signed)
I called Dr. Aleda Grana office I [place] [phone] to obtain fax # [phone]. I will fax over Myoview results to Dr. Aleda Grana in [place] today per pt request. I verified with office in [place] that I had the right office and location before sending out results.

## 2016-10-30 NOTE — Telephone Encounter (Signed)
Pt notified of Myoview results by phone with verbal understanding. Pt aware to continue current Tx plan. Advised pt I will send a copy of results to PCP. Pt asked if I would send a copy to Dr. Renaldo Reel as well in [place]. Pt wanted to thank all of our staff for being so kind and helpful to him especially Friday when he came in for his test.

## 2016-11-23 ENCOUNTER — Telehealth: Payer: Self-pay | Admitting: Cardiovascular Disease

## 2016-11-23 NOTE — Telephone Encounter (Signed)
Jeremiah Holt is calling in reference to the monitor that he is wearing . He has a question about it . Please call

## 2016-11-23 NOTE — Telephone Encounter (Signed)
Spoke with patient who called to ask if he can remove his 30 day monitor a few days early.  He states the end date should be 12/10 but his skin is irritated and he would like to remove it on 12/8 or 12/9.  I advised him that it would be fine for him to d/c a few days early and mail in.  He verbalized understanding and thanked me for the call.

## 2016-12-01 ENCOUNTER — Telehealth: Payer: Self-pay | Admitting: *Deleted

## 2016-12-01 NOTE — Telephone Encounter (Signed)
DPR ok to [facility]. Lmom monitor normal sinus rhythm. If any questions call [phone].

## 2016-12-07 DIAGNOSIS — L570 Actinic keratosis: Secondary | ICD-10-CM | POA: Diagnosis not present

## 2016-12-07 DIAGNOSIS — D1801 Hemangioma of skin and subcutaneous tissue: Secondary | ICD-10-CM | POA: Diagnosis not present

## 2016-12-07 DIAGNOSIS — L309 Dermatitis, unspecified: Secondary | ICD-10-CM | POA: Diagnosis not present

## 2016-12-07 DIAGNOSIS — L821 Other seborrheic keratosis: Secondary | ICD-10-CM | POA: Diagnosis not present

## 2017-06-06 ENCOUNTER — Telehealth: Payer: Self-pay | Admitting: Neurology

## 2017-06-06 DIAGNOSIS — G4733 Obstructive sleep apnea (adult) (pediatric): Secondary | ICD-10-CM

## 2017-06-06 DIAGNOSIS — Z9989 Dependence on other enabling machines and devices: Principal | ICD-10-CM

## 2017-06-06 NOTE — Telephone Encounter (Signed)
Pt said his CPAP has been broken for the past 1-1/2 week. It is being repaired and he has been supplied with a loner but has not used it. He said since stopping the CPAP he feels great, wife said he his not snoring much either. He does not want to start the loner or use a CPAP at all. Please call to discuss

## 2017-06-11 NOTE — Telephone Encounter (Signed)
Ok for pt to stop CPAP?

## 2017-06-12 NOTE — Addendum Note (Signed)
Addended by: Larey Seat on: 06/12/2017 06:05 PM   Modules accepted: Orders

## 2017-06-12 NOTE — Telephone Encounter (Signed)
Mr. Jeremiah Holt experience may relate to improvement of apnea with recent weight loss, it is certainly possible that his current settings do not longer serve him as well after he lost about 12 pounds. Instead of prescribing a set pressure CPAP to him I will ask our [facility] to give him a 14 day period of an auto titrate her between 5 and 10 cm water. This will help [place] to find if he still has residual apnea at all, if the autotitrator suggests a pressure of less than 6 cm needed I would be happy to discontinue CPAP therapy with the necessary medical documentation. Mr. Jeremiah Holt agreed and understood my plan. I will send the order to [facility].CD

## 2017-06-13 NOTE — Telephone Encounter (Signed)
Order for auto pap trial sent to [facility] per Dr. Brett Fairy request.

## 2017-06-18 DIAGNOSIS — R7301 Impaired fasting glucose: Secondary | ICD-10-CM | POA: Diagnosis not present

## 2017-06-18 DIAGNOSIS — E784 Other hyperlipidemia: Secondary | ICD-10-CM | POA: Diagnosis not present

## 2017-06-18 DIAGNOSIS — I10 Essential (primary) hypertension: Secondary | ICD-10-CM | POA: Diagnosis not present

## 2017-06-21 DIAGNOSIS — H5213 Myopia, bilateral: Secondary | ICD-10-CM | POA: Diagnosis not present

## 2017-06-21 DIAGNOSIS — H25042 Posterior subcapsular polar age-related cataract, left eye: Secondary | ICD-10-CM | POA: Diagnosis not present

## 2017-06-21 DIAGNOSIS — H35372 Puckering of macula, left eye: Secondary | ICD-10-CM | POA: Diagnosis not present

## 2017-06-21 DIAGNOSIS — H2511 Age-related nuclear cataract, right eye: Secondary | ICD-10-CM | POA: Diagnosis not present

## 2017-06-25 DIAGNOSIS — C61 Malignant neoplasm of prostate: Secondary | ICD-10-CM | POA: Diagnosis not present

## 2017-06-25 DIAGNOSIS — I10 Essential (primary) hypertension: Secondary | ICD-10-CM | POA: Diagnosis not present

## 2017-06-25 DIAGNOSIS — E668 Other obesity: Secondary | ICD-10-CM | POA: Diagnosis not present

## 2017-06-25 DIAGNOSIS — Z1389 Encounter for screening for other disorder: Secondary | ICD-10-CM | POA: Diagnosis not present

## 2017-06-25 DIAGNOSIS — E784 Other hyperlipidemia: Secondary | ICD-10-CM | POA: Diagnosis not present

## 2017-06-25 DIAGNOSIS — R7301 Impaired fasting glucose: Secondary | ICD-10-CM | POA: Diagnosis not present

## 2017-06-25 DIAGNOSIS — G4733 Obstructive sleep apnea (adult) (pediatric): Secondary | ICD-10-CM | POA: Diagnosis not present

## 2017-06-25 DIAGNOSIS — Z0000 Encounter for general adult medical examination without abnormal findings: Secondary | ICD-10-CM | POA: Diagnosis not present

## 2017-06-25 DIAGNOSIS — G5762 Lesion of plantar nerve, left lower limb: Secondary | ICD-10-CM | POA: Diagnosis not present

## 2017-06-25 DIAGNOSIS — Z6830 Body mass index (BMI) 30.0-30.9, adult: Secondary | ICD-10-CM | POA: Diagnosis not present

## 2017-06-25 DIAGNOSIS — Z86010 Personal history of colonic polyps: Secondary | ICD-10-CM | POA: Diagnosis not present

## 2017-07-02 DIAGNOSIS — L249 Irritant contact dermatitis, unspecified cause: Secondary | ICD-10-CM | POA: Diagnosis not present

## 2017-07-10 DIAGNOSIS — L570 Actinic keratosis: Secondary | ICD-10-CM | POA: Diagnosis not present

## 2017-07-10 DIAGNOSIS — D1801 Hemangioma of skin and subcutaneous tissue: Secondary | ICD-10-CM | POA: Diagnosis not present

## 2017-07-10 DIAGNOSIS — L821 Other seborrheic keratosis: Secondary | ICD-10-CM | POA: Diagnosis not present

## 2017-08-01 ENCOUNTER — Telehealth: Payer: Self-pay | Admitting: Pharmacist

## 2017-08-01 MED ORDER — LOSARTAN POTASSIUM 25 MG PO TABS: 25.0000 mg | ORAL_TABLET | Freq: Every day | ORAL | 3 refills | Status: DC

## 2017-08-01 NOTE — Telephone Encounter (Signed)
Spoke to patient answered questions about recall. Pt reports pressures have been running low. Will change to losartan 25mg  daily. Advised to continue to monitor pressures and call with any issues/changes. Pt states appreciation and understanding.

## 2017-09-13 DIAGNOSIS — Z8546 Personal history of malignant neoplasm of prostate: Secondary | ICD-10-CM | POA: Diagnosis not present

## 2017-09-13 DIAGNOSIS — Z923 Personal history of irradiation: Secondary | ICD-10-CM | POA: Diagnosis not present

## 2017-10-06 DIAGNOSIS — Z23 Encounter for immunization: Secondary | ICD-10-CM | POA: Diagnosis not present

## 2017-10-25 ENCOUNTER — Ambulatory Visit: Payer: Medicare Other | Admitting: Neurology

## 2018-01-14 DIAGNOSIS — L821 Other seborrheic keratosis: Secondary | ICD-10-CM | POA: Diagnosis not present

## 2018-01-14 DIAGNOSIS — D1801 Hemangioma of skin and subcutaneous tissue: Secondary | ICD-10-CM | POA: Diagnosis not present

## 2018-01-14 DIAGNOSIS — D225 Melanocytic nevi of trunk: Secondary | ICD-10-CM | POA: Diagnosis not present

## 2018-01-14 DIAGNOSIS — L570 Actinic keratosis: Secondary | ICD-10-CM | POA: Diagnosis not present

## 2018-01-15 ENCOUNTER — Encounter: Payer: Self-pay | Admitting: Neurology

## 2018-01-15 ENCOUNTER — Ambulatory Visit (INDEPENDENT_AMBULATORY_CARE_PROVIDER_SITE_OTHER): Payer: Medicare Other | Admitting: Neurology

## 2018-01-15 VITALS — BP 140/92 | HR 97 | Ht 71.0 in | Wt 221.0 lb

## 2018-01-15 DIAGNOSIS — Z9989 Dependence on other enabling machines and devices: Secondary | ICD-10-CM

## 2018-01-15 DIAGNOSIS — G4733 Obstructive sleep apnea (adult) (pediatric): Secondary | ICD-10-CM | POA: Diagnosis not present

## 2018-01-15 NOTE — Progress Notes (Signed)
[facility]  Provider:  Larey Seat, M D  Referring Provider: Marton Redwood, MD Primary Care Physician:  Marton Redwood, MD  Chief Complaint  Patient presents with  . Follow-up    pt alone, rm 11. [facility], states CPAP is well    HPI:  Jeremiah Holt is a 67 y.o. male , who is seen here as a referral from Dr. Brigitte Pulse for a re-evaluation of sleep apnea.  The patient is of [place], [place] in [place], was referred to a PSG at the [facility] in 2013, was diagnosed with OSA and returned for a CPA titration.  He was not satisfied with the test facility and the way he received results. He couldn't sleep in the lab and was fitted with a FFM which caused pressure marks. He is here because of recent changes in fatigue and well being , just this March 2015 he became extremely fatigued, irritable and depressed. He sees himself as energetic and was always very energetic at work until this spring. He continued to snore , his apnea remained untreated since diagnosis.  He has described spells of sleep choking, causing a fear of suffocation. His first time in October 2013 and now again, he had to ask his wife to hold him up, allowing him to breath deep enough to calm down.this also caused anxiety in other aspects of his life, his work productivity is declined, his children noted during vacation. Little things now make him anxious.  He would consider himself a habitual mouth breather. He has some allergic rhinitis. His wife reports snoring, when supine not in a recliner.   He goes to bed between 10.00 PM and shortly after goes to sleep. He likes to listen to an audio book with headphones. He rises at 7 AM, spontaneously.  He ha no nocturia but wakes 2-4 times , variable duration. No headaches , no pain. He wakes in general not feeling refreshed or restored. If he takes a nap in the afternoon, after lunch  And he wakes much more refreshed, after 30-45  minutes. Naps do not affect his nocturnal sleep. He remembers complex dreams, long, detailed, vivid. No dream acting out. No RLS, no pain.  He prefers prone sleep. 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 There is no family history of sleep apnea. His father was napping frequently.  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 He has become a believer in CPAP use, after being highly sceptical the first month. Now, he stated : "I would pay for it". His sleep study was performed in August by [facility] protocol.  His residual AHI is 3.5/hr.  Hourly use, 8 hours 37 minutes, 97%.   10-27-15 Interval history -  The patient reports that for the first year that he used CPAP at the results were remarkable but for the last 2 months he has felt again fatigued. His compliance record shows that he uses the machine 100% of the time all days over 4 hours. Average user time 7 hours 1 minute, set pressure 6 cm water was 1 cm EPR and the residual AHI is 2.0. All these are very desirable results. There is no major air leak noted. He endorsed today the Epworth sleepiness score at 4 and the fatigue severity score at 40 2., the fatigue score clearly has risen. An overnight pulse oximetry have been obtained through his primary  care physician showing that the patient had a stable heart rate throughout the night and that he did not have significant or prolonged desaturations. The baseline oxygen saturation was 93.7% and the time less then 88% saturation was 1.2 minutes. He would not be qualifying for additional oxygen. He was able to reduce the blood pressure medicine dose by health. By end of August  he felt an increasing fatigue again and now takes a half hour nap each day , which refreshes him.  10-26-2016  Spends more time in [facility], teaches at [employer], and keeps busy. [name] still travels a lot but mostly now within the [place]. He would like a trouble friendly  smaller CPAP machine that allows packing an overnight bag. He has been 100% compliant CPAP user has used the machine for 4 consecutive hours on 29 of the last 30 days, average user time 6 hours 22 minutes CPAP still set at 6 cm water was 1 cm EPR and residual apnea is 2.6. He uses the machine before midnight. No fatigue.  Nasal pillow.   01-15-2018, I have the pleasure of seeing [name] today who has been a compliant CPAP user for many years.  Today's download is not different with 97% compliance and average of 6 hours and 49 minutes of daily CPAP use residual AHI of only 2.0/h.  There are some air leaks but there are moderate, 95th percentile pressure is 9.5 cm on an AutoSet with a pressure window between 5 and 10 cmH2O and 3 cm EPR. The patient uses a nasal pillow at this time which sometimes causes him to itch.  He is interested in changing or having the alternative use of a full facemask.  I will ask [facility] to fit him for such a mask.  His Epworth sleepiness score is endorsed at only 3 points, fatigue severity at 18, geriatric depression at 2 out of 15 points.    Review of Systems: Out of a complete 14 system review, the patient complains of only the following symptoms, and all other reviewed systems are negative.  The patient endorsed the Epworth sleepiness score at 3 points. FSS 18, GDS 2 points.    Social History   Socioeconomic History  . Marital status: Married    Spouse name: [name]  . Number of children: 2  . Years of education: [name]  . Highest education level: Not on file  Social Needs  . Financial resource strain: Not on file  . Food insecurity - worry: Not on file  . Food insecurity - inability: Not on file  . Transportation needs - medical: Not on file  . Transportation needs - non-medical: Not on file  Occupational History  . Not on file  Tobacco Use  . Smoking status: Former Smoker    Types: Cigars  . Smokeless tobacco: Never Used  Substance and Sexual  Activity  . Alcohol use: Yes    Comment: 1-2 glasses of wine daily  . Drug use: No  . Sexual activity: Not on file  Other Topics Concern  . Not on file  Social History Narrative   Patient is married ([name]) and lives at home with his wife.   Patient has two adult children.   Patient has a Financial risk analyst.   Patient is right-handed.   Patient drinks three cups of coffee daily.    Family History  Problem Relation Age of Onset  . Lymphoma Father   . Hypertension Father   . Prostate cancer  Father   . Heart attack Father 47  . Congestive Heart Failure Mother   . Hypertension Mother     Past Medical History:  Diagnosis Date  . Cancer ([place])   . Fatigue   . Hearing loss   . History of echocardiogram    Echo 11/17: EF 55-60, normal wall motion, Gr 1 diastolic dysfunction, mildly dilated aortic root and ascending aorta (Ao root 40/ascending aorta 38), MAC  . History of nuclear stress test    Myoview 11/17:  Ex 9'; no ischemia or infarct, HTN response to stress with peak BP 192/75; Low Risk  . Hypertension     No past surgical history on file.  Current Outpatient Medications  Medication Sig Dispense Refill  . aspirin 81 MG tablet Take 81 mg by mouth daily.    Marland Kitchen atorvastatin (LIPITOR) 40 MG tablet Take 40 mg by mouth daily.     . cetirizine (ZYRTEC) 10 MG tablet Take 10 mg by mouth daily as needed.     . GUAIFENESIN PO Take 1 tablet by mouth at bedtime. 400 mg-66m    . losartan (COZAAR) 25 MG tablet Take 1 tablet (25 mg total) by mouth daily. 90 tablet 3   No current facility-administered medications for this visit.     Allergies as of 01/15/2018 - Review Complete 01/15/2018  Allergen Reaction Noted  . Efudex [fluorouracil] Dermatitis 10/26/2014    Vitals: BP (!) 140/92   Pulse 97   Ht 5' 11"  (1.803 m)   Wt 221 lb (100.2 kg)   BMI 30.82 kg/m  Last Weight:  Wt Readings from Last 1 Encounters:  01/15/18 221 lb (100.2 kg)   Last Height:   Ht Readings from Last  1 Encounters:  01/15/18 5' 11"  (1.803 m)   Physical exam: General: The patient is awake, alert and appears not in acute distress. The patient is well groomed. Head: Normocephalic, atraumatic. Neck is supple. Mallampati 4 ,all natural teeth.   neck circumference: 17. 45." with a double chin.  Cardiovascular:  Regular rate and rhythm , without  murmurs or carotid bruit, and without distended neck veins. Respiratory: Lungs are clear to auscultation. Skin:  Without evidence of edema, or rash, rosacea . Trunk: BMI 30. 82 ,normal posture.  Neurologic exam : The patient is awake and alert, oriented to place and time. Memory subjective described as intact. There is a normal attention span & concentration ability.  Cranial nerves:No change of smell or taste. Extraocular movements  in vertical and horizontal planes intact and without nystagmus. Visual fields by finger perimetry are intact. Hearing to finger rub intact. Facial motor strength is symmetric and tongue and uvula move midline. Finger-to-nose maneuver tested and normal without evidence of ataxia, dysmetria or tremor.  Assessment:  15 minute visit time with more than 50% of face to face tie dedicated to discussion of changes in lifestyle. I cannot related the surge in fatigue to any changes in his OSA treatment and the ONO did not show any desaturation. The patient may have responded to a flu shot.    After physical and neurologic examination, review of laboratory studies, imaging, neurophysiology testing and pre-existing records,  Plan:  Treatment plan and additional workup : Continue OSA treatment with CPAP, auto set 5-15. change to FFM per patients request.      CLarey Seat MD  Cc Dr SBrigitte Pulse

## 2018-03-22 DIAGNOSIS — M79645 Pain in left finger(s): Secondary | ICD-10-CM | POA: Diagnosis not present

## 2018-03-22 DIAGNOSIS — Z6831 Body mass index (BMI) 31.0-31.9, adult: Secondary | ICD-10-CM | POA: Diagnosis not present

## 2018-06-25 DIAGNOSIS — H2511 Age-related nuclear cataract, right eye: Secondary | ICD-10-CM | POA: Diagnosis not present

## 2018-06-25 DIAGNOSIS — H5213 Myopia, bilateral: Secondary | ICD-10-CM | POA: Diagnosis not present

## 2018-06-25 DIAGNOSIS — H353121 Nonexudative age-related macular degeneration, left eye, early dry stage: Secondary | ICD-10-CM | POA: Diagnosis not present

## 2018-06-25 DIAGNOSIS — H35372 Puckering of macula, left eye: Secondary | ICD-10-CM | POA: Diagnosis not present

## 2018-06-28 DIAGNOSIS — E7849 Other hyperlipidemia: Secondary | ICD-10-CM | POA: Diagnosis not present

## 2018-06-28 DIAGNOSIS — I10 Essential (primary) hypertension: Secondary | ICD-10-CM | POA: Diagnosis not present

## 2018-06-28 DIAGNOSIS — R7301 Impaired fasting glucose: Secondary | ICD-10-CM | POA: Diagnosis not present

## 2018-06-28 DIAGNOSIS — R82998 Other abnormal findings in urine: Secondary | ICD-10-CM | POA: Diagnosis not present

## 2018-07-04 DIAGNOSIS — Z0000 Encounter for general adult medical examination without abnormal findings: Secondary | ICD-10-CM | POA: Diagnosis not present

## 2018-07-04 DIAGNOSIS — Z1389 Encounter for screening for other disorder: Secondary | ICD-10-CM | POA: Diagnosis not present

## 2018-07-04 DIAGNOSIS — G4733 Obstructive sleep apnea (adult) (pediatric): Secondary | ICD-10-CM | POA: Diagnosis not present

## 2018-07-04 DIAGNOSIS — Z86010 Personal history of colonic polyps: Secondary | ICD-10-CM | POA: Diagnosis not present

## 2018-07-04 DIAGNOSIS — I10 Essential (primary) hypertension: Secondary | ICD-10-CM | POA: Diagnosis not present

## 2018-07-04 DIAGNOSIS — E668 Other obesity: Secondary | ICD-10-CM | POA: Diagnosis not present

## 2018-07-04 DIAGNOSIS — L84 Corns and callosities: Secondary | ICD-10-CM | POA: Diagnosis not present

## 2018-07-04 DIAGNOSIS — K219 Gastro-esophageal reflux disease without esophagitis: Secondary | ICD-10-CM | POA: Diagnosis not present

## 2018-07-04 DIAGNOSIS — R7301 Impaired fasting glucose: Secondary | ICD-10-CM | POA: Diagnosis not present

## 2018-07-04 DIAGNOSIS — Z6830 Body mass index (BMI) 30.0-30.9, adult: Secondary | ICD-10-CM | POA: Diagnosis not present

## 2018-07-04 DIAGNOSIS — C61 Malignant neoplasm of prostate: Secondary | ICD-10-CM | POA: Diagnosis not present

## 2018-07-04 DIAGNOSIS — E7849 Other hyperlipidemia: Secondary | ICD-10-CM | POA: Diagnosis not present

## 2018-07-05 DIAGNOSIS — Z1212 Encounter for screening for malignant neoplasm of rectum: Secondary | ICD-10-CM | POA: Diagnosis not present

## 2018-08-28 IMAGING — NM NM MISC PROCEDURE
6 series · 36 of 36 positions shown · non-contrast
Comparison: none

[Series 1: wbr_r-proj_st rest · 6.51mm/px · 6 of 64 frames shown]
[frame 6/64]
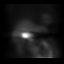
[frame 16/64]
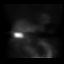
[frame 27/64]
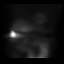
[frame 38/64]
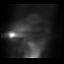
[frame 48/64]
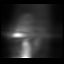
[frame 59/64]
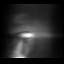

[Series 1: rest · 6.51mm/px · 6 of 64 frames shown]
[frame 6/64]
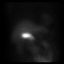
[frame 16/64]
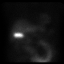
[frame 27/64]
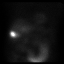
[frame 38/64]
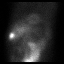
[frame 48/64]
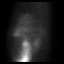
[frame 59/64]
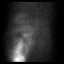

[Series 2: wbr_s-proj_st stress · 6.51mm/px · 6 of 512 frames shown (1 of 2)]
[frame 43/512]
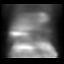
[frame 128/512]
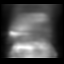
[frame 214/512]
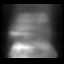
[frame 299/512]
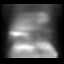
[frame 384/512]
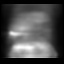
[frame 470/512]
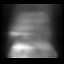

[Series 2: wbr_s-proj_st stress · 6.51mm/px · 6 of 64 frames shown (2 of 2)]
[frame 6/64]
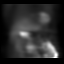
[frame 16/64]
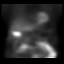
[frame 27/64]
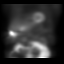
[frame 38/64]
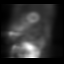
[frame 48/64]
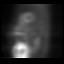
[frame 59/64]
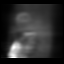

[Series 2: stress · 6.51mm/px · 6 of 512 frames shown (1 of 2)]
[frame 43/512]
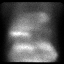
[frame 128/512]
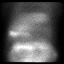
[frame 214/512]
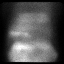
[frame 299/512]
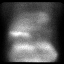
[frame 384/512]
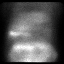
[frame 470/512]
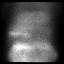

[Series 2: stress · 6.51mm/px · 6 of 64 frames shown (2 of 2)]
[frame 6/64]
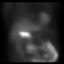
[frame 16/64]
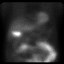
[frame 27/64]
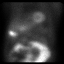
[frame 38/64]
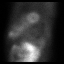
[frame 48/64]
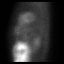
[frame 59/64]
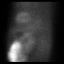

[36 of 36 positions shown; findings below may reference images not displayed]

Canned report from images found in remote index.

Refer to host system for actual result text.

## 2018-10-17 DIAGNOSIS — Z923 Personal history of irradiation: Secondary | ICD-10-CM | POA: Diagnosis not present

## 2018-10-17 DIAGNOSIS — Z8546 Personal history of malignant neoplasm of prostate: Secondary | ICD-10-CM | POA: Diagnosis not present

## 2018-11-20 DIAGNOSIS — Z23 Encounter for immunization: Secondary | ICD-10-CM | POA: Diagnosis not present

## 2019-01-16 ENCOUNTER — Ambulatory Visit: Payer: Medicare Other | Admitting: Neurology

## 2019-03-25 ENCOUNTER — Telehealth: Payer: Self-pay | Admitting: Neurology

## 2019-03-25 ENCOUNTER — Encounter: Payer: Self-pay | Admitting: Neurology

## 2019-03-25 NOTE — Addendum Note (Signed)
Addended by: Darleen Crocker on: 03/25/2019 10:50 AM   Modules accepted: Orders

## 2019-03-25 NOTE — Telephone Encounter (Signed)
Called the patient to inform them that our office has placed new protocols in place for our office visits. There was no answer, LVM informing the patient to call back so I can go over the options to still be able to keep his apt scheduled.  If patient calls back (and I am unable to take call) please review that we are seeing patients in video visits and ask if he has that capability. If he does not have access to complete virtual visit then we can offer telephone visit. Pt is over a yr past apt and if possible would be best to keep his apt.

## 2019-03-25 NOTE — Telephone Encounter (Signed)
Patient called back and spoke to him. Informed him them our office has placed new protocols in place for our office visits. Due to the virus pandemic our office is reducing our number of office visits in order to minimize the risk to our patients and healthcare providers. Advised that our office is now providing the capability to offer the patients phone visits at this time. Informed of what that process looks like and informed that the telephone office visit will still be billed through insurance and due to [place] we need them to know since the appointment is taking place over the phone, we can't guarantee the security of the phone line. With that said if we do move forward I would have to get verbal consent to completed the call over the phone.  Patient verbalized understanding and gave verbal consent to completed the video visit. I have reviewed the patient's chart and advised that I will send message through my chart that will allow him to complete the Sleepy scale. Pt verbalized understanding and will update meds after he reviews them if there was something else to add. Patient was appreciative for the call and looks forward to the video visit. E mail has been sent to email on file.

## 2019-03-31 ENCOUNTER — Encounter: Payer: Self-pay | Admitting: Neurology

## 2019-04-02 ENCOUNTER — Other Ambulatory Visit: Payer: Self-pay

## 2019-04-02 ENCOUNTER — Ambulatory Visit (INDEPENDENT_AMBULATORY_CARE_PROVIDER_SITE_OTHER): Payer: Medicare Other | Admitting: Neurology

## 2019-04-02 ENCOUNTER — Encounter: Payer: Self-pay | Admitting: Neurology

## 2019-04-02 DIAGNOSIS — G4733 Obstructive sleep apnea (adult) (pediatric): Secondary | ICD-10-CM

## 2019-04-02 DIAGNOSIS — I10 Essential (primary) hypertension: Secondary | ICD-10-CM | POA: Diagnosis not present

## 2019-04-02 DIAGNOSIS — E7800 Pure hypercholesterolemia, unspecified: Secondary | ICD-10-CM

## 2019-04-02 DIAGNOSIS — Z9989 Dependence on other enabling machines and devices: Secondary | ICD-10-CM

## 2019-04-02 NOTE — Progress Notes (Signed)
Virtual Visit via Video Note  I connected with Jeremiah Holt on 04/02/19 at  8:30 AM EDT by a video enabled telemedicine application and verified that I am speaking with the correct person using two identifiers.   I discussed the limitations of evaluation and management by telemedicine and the availability of in person appointments. The patient expressed understanding and agreed to proceed.  History of Present Illness:   2015: Jeremiah Holt is a 68 y.o. male , who was seen here as a referral from Dr. Brigitte Pulse for a re-evaluation of sleep apnea.  The patient is of [place], raised in the free [place]. He used to work for the Control and instrumentation engineer , travelled a lot.  He had been originally  referred for  a PSG at the [facility] in 2013, was diagnosed with OSA and returned there for a CPAP titration.He was not satisfied with the test facility and the way he received results. He couldn't sleep in the lab and was fitted with a FFM which caused pressure marks. He is here because of recent changes in fatigue and well being , just this March 2015 he became extremely fatigued, irritable and depressed. He sees himself as energetic and was always very energetic at work until this spring. He continued to snore , his apnea remained untreated since diagnosis.   He has described spells of sleep choking, causing a fear of suffocation. His first time in October 2013 and now again, he had to ask his wife to hold him up, allowing him to breath deep enough to calm down.this also caused anxiety in other aspects of his life, his work productivity is declined, his children noted during vacation. Little things now make him anxious.  He would consider himself a habitual mouth breather. He has some allergic rhinitis. His wife reports snoring, when supine not in a recliner.   A repeat PSG at [facility]  on 07-28-2014 resulted in a diagosis of severe apnea at AHI 46.4h and supine 89/h., and CPAP was  initiated .   04-02-2019: The patient is meanwhile 68 years old, he has been a very compliant CPAP user over the last 4-1/2 years.  He is using the CPAP daily, he does take a power nap now in the afternoon usually less than 30 minutes in duration.  I recall that prior to CPAP initiation this nap was a daily occurrence of 60 minutes or longer.  He cleans his CPAP daily with an ionized air cleaner and wipes mask and non-heated tubing visit vinegar solution.  He is using a Velcro headgear for his full facemask.  He endorsed today the Epworth sleepiness score at 2 points out of 24 and reported no fatigue.  He is happy with his durable medical equipment company, [facility].  He is using a [employer] and stated that the replacement supplies he has recently received were for the mainly same product but the headgear is "flimsy" and the mask had at times deteriorated and broken off at the seams.  He has a 90% 97% compliance for days he only had 1 day in March with a power outage that prevented him from using CPAP, and he has used it over 4 hours on 93% of the time.  His machine is an AutoSet between 5 and 10 cmH2O pressure with a 3 cm expiratory pressure relief at a residual AHI of 2.5.  Residual central apneas are 0.7 and obstructive apneas 1.0.  The 95th  percentile pressure is at 8.9 cmH2O.  There are no major changes of the patient's social history he usually lives over the winter in [place] this was cut short this year after 3 months, he left [place] in early March. Lives with wife, retired.   Today's exam is more or less an observational exam.His machine would now be 70.70 years old.      ROS :   How likely are you to doze in the following situations: 0 = not likely, 1 = slight chance, 2 = moderate chance, 3 = high chance  Sitting and Reading? 1 Watching Television? Sitting inactive in a public place (theater or meeting)? Lying down in the afternoon when circumstances  permit? Sitting and talking to someone? Sitting quietly after lunch without alcohol? 1 In a car, while stopped for a few minutes in traffic? As a passenger in a car for an hour without a break?  Total = 2   The patient walks for exercise daily 4 to 5 miles 6 days a week, he is a nontobacco user     Observations/Objective:  The patient is alert and oriented, he reports no major changes in weight, facial symmetry is preserved, pupils appear equal in size, no ptosis no facial droop symmetric shoulder shrug and upper extremity movements.  Speech is clear without dysphonia, no nasal congestion, speech is not interrupted by shortness of breath.  Blood pressure at home has been 131/77 mmHg in the morning, his neck size is unchanged, Mallampati is unchanged,  The patient walks for exercise daily 4 to 5 miles 6 days a week, he is a nontobacco user   Assessment and Plan: continue using CPAP as currenty done. I will forward his comments about the mask quality to [facility].  The patient was under the impression that his machine would to be replaced next year - after the end of our video conversation I found he is due in August of this year.   I will offer him a HST in July to allow a current  baseline AHI to be established, he can then get a new autotitration device with similar settings as the current one. 5 through 10 cm water, 3 cm EPR, interface of his choice.    Follow Up Instructions: RV was planned in 70 month- patient's compliance is excellent. We will advance this visit to 3 month after 08-06-2019 , when he has received and used a new CPAP.     I discussed the assessment and treatment plan with the patient. The patient was provided an opportunity to ask questions and all were answered. The patient agreed with the plan and demonstrated an understanding of the instructions.   The patient was advised to call back or seek an in-person evaluation if the symptoms worsen or if the condition fails  to improve as anticipated.  I provided 19 minutes of non-face-to-face time during this encounter.   Larey Seat, MD

## 2019-04-02 NOTE — Patient Instructions (Signed)
Dear Mr. Barley,  During our video conference today we were under the assumption that you machine would be 70 years old by August 2021, but it is actually ready for replacement this year. This error let me to ask for a revisit in 12 months.  However I will advance the home sleep test to confirm that you still have apnea into this year and I suggest that we do that test around July 2020, I can then order a new CPAP for you, again with auto- titration capacity, same settings and meet face to face after about 90 days of use.   Please let me know if this fits into your plans for late Summer 2020, as you may leave for [place]?   You can e-mail [email or web address] or leave a phone message at [phone]  Sincerely  Larey Seat, MD

## 2019-05-14 DIAGNOSIS — D1801 Hemangioma of skin and subcutaneous tissue: Secondary | ICD-10-CM | POA: Diagnosis not present

## 2019-05-14 DIAGNOSIS — D225 Melanocytic nevi of trunk: Secondary | ICD-10-CM | POA: Diagnosis not present

## 2019-05-14 DIAGNOSIS — L570 Actinic keratosis: Secondary | ICD-10-CM | POA: Diagnosis not present

## 2019-05-14 DIAGNOSIS — D485 Neoplasm of uncertain behavior of skin: Secondary | ICD-10-CM | POA: Diagnosis not present

## 2019-06-30 ENCOUNTER — Ambulatory Visit (INDEPENDENT_AMBULATORY_CARE_PROVIDER_SITE_OTHER): Payer: Medicare Other | Admitting: Neurology

## 2019-06-30 DIAGNOSIS — I10 Essential (primary) hypertension: Secondary | ICD-10-CM

## 2019-06-30 DIAGNOSIS — G4733 Obstructive sleep apnea (adult) (pediatric): Secondary | ICD-10-CM

## 2019-06-30 DIAGNOSIS — Z9989 Dependence on other enabling machines and devices: Secondary | ICD-10-CM

## 2019-06-30 DIAGNOSIS — E7800 Pure hypercholesterolemia, unspecified: Secondary | ICD-10-CM

## 2019-07-01 DIAGNOSIS — H5213 Myopia, bilateral: Secondary | ICD-10-CM | POA: Diagnosis not present

## 2019-07-01 DIAGNOSIS — H25042 Posterior subcapsular polar age-related cataract, left eye: Secondary | ICD-10-CM | POA: Diagnosis not present

## 2019-07-01 DIAGNOSIS — H35373 Puckering of macula, bilateral: Secondary | ICD-10-CM | POA: Diagnosis not present

## 2019-07-01 DIAGNOSIS — H2511 Age-related nuclear cataract, right eye: Secondary | ICD-10-CM | POA: Diagnosis not present

## 2019-07-03 DIAGNOSIS — E7849 Other hyperlipidemia: Secondary | ICD-10-CM | POA: Diagnosis not present

## 2019-07-03 DIAGNOSIS — R7301 Impaired fasting glucose: Secondary | ICD-10-CM | POA: Diagnosis not present

## 2019-07-03 DIAGNOSIS — I10 Essential (primary) hypertension: Secondary | ICD-10-CM | POA: Diagnosis not present

## 2019-07-07 DIAGNOSIS — R82998 Other abnormal findings in urine: Secondary | ICD-10-CM | POA: Diagnosis not present

## 2019-07-07 DIAGNOSIS — I10 Essential (primary) hypertension: Secondary | ICD-10-CM | POA: Diagnosis not present

## 2019-07-10 DIAGNOSIS — K219 Gastro-esophageal reflux disease without esophagitis: Secondary | ICD-10-CM | POA: Diagnosis not present

## 2019-07-10 DIAGNOSIS — I10 Essential (primary) hypertension: Secondary | ICD-10-CM | POA: Diagnosis not present

## 2019-07-10 DIAGNOSIS — R7301 Impaired fasting glucose: Secondary | ICD-10-CM | POA: Diagnosis not present

## 2019-07-10 DIAGNOSIS — M25511 Pain in right shoulder: Secondary | ICD-10-CM | POA: Diagnosis not present

## 2019-07-10 DIAGNOSIS — C61 Malignant neoplasm of prostate: Secondary | ICD-10-CM | POA: Diagnosis not present

## 2019-07-10 DIAGNOSIS — Z1331 Encounter for screening for depression: Secondary | ICD-10-CM | POA: Diagnosis not present

## 2019-07-10 DIAGNOSIS — Z0000 Encounter for general adult medical examination without abnormal findings: Secondary | ICD-10-CM | POA: Diagnosis not present

## 2019-07-10 DIAGNOSIS — Z86010 Personal history of colonic polyps: Secondary | ICD-10-CM | POA: Diagnosis not present

## 2019-07-10 DIAGNOSIS — E785 Hyperlipidemia, unspecified: Secondary | ICD-10-CM | POA: Diagnosis not present

## 2019-07-10 DIAGNOSIS — Z1339 Encounter for screening examination for other mental health and behavioral disorders: Secondary | ICD-10-CM | POA: Diagnosis not present

## 2019-07-10 DIAGNOSIS — E669 Obesity, unspecified: Secondary | ICD-10-CM | POA: Diagnosis not present

## 2019-07-10 DIAGNOSIS — G4733 Obstructive sleep apnea (adult) (pediatric): Secondary | ICD-10-CM | POA: Diagnosis not present

## 2019-07-16 ENCOUNTER — Encounter: Payer: Self-pay | Admitting: Neurology

## 2019-07-16 NOTE — Addendum Note (Signed)
Addended by: Larey Seat on: 07/16/2019 01:04 PM   Modules accepted: Orders

## 2019-07-16 NOTE — Procedures (Signed)
  Patient Information     First Name: Jeremiah Last Name: Holt ID: [id number]  Birth Date: 02-19-1951 Age: 68 Gender: Male  Referring Provider: Marton Redwood, MD Weight: 221 lbs.     Epworth:  2/24   Sleep Study Information    Study Date: Jul 13-14, 2020 S/H/A Version: 001.001.001.001 / 4.1.1528 / 45  History:     [name] is a 68 y.o. male, who was seen here as a referral from Dr. Brigitte Pulse for a re-evaluation of sleep apnea.  The patient is of [place] descent. He had been originally referred for a PSG at the [facility] in 2013, was diagnosed with OSA and returned later here for a new evaluation, was again diagnosed with OSA  and has become a compliant user of CPAP. Last seen in a virtual visit on 04-02-2019. His machine is now 70 years old and needs replacement.       Summary & Diagnosis:    Mild overall obstructive sleep apnea at AHI 8.5/h with REM exacerbation to 16.7/h. Intermittent bradycardic heart rate, no prolonged or clinically relevant hypoxia. Sleep apnea was accentuated by supine and prone sleep position.    Recommendations:      Continue CPAP therapy for REM dependent sleep apnea with an auto-titration capable CPAP device, settings form 5 through 10 cm water pressure, heated humidity and mask of choice.   Electronically Signed:  Larey Seat, MD   07-16-2019            Sleep Summary  Oxygen Saturation Statistics   Start Study Time: End Study Time: Total Recording Time:  10:18:14 PM   8:19:13 AM 10 h, 0 min  Total Sleep Time % REM of Sleep Time:  7 h, 14 min  15.8    Mean: 95 Minimum: 87 Maximum: 99  Mean of Desaturations Nadirs (%):   92  Oxygen Desaturation. %: 4-9 10-20 >20 Total  Events Number Total  10 100.0  0 0.0  0 0.0  10 100.0  Oxygen Saturation: <90 <=88 <85 <80 <70  Duration (minutes): Sleep % 0.3 0.1 0.1 0.0 0.0 0.0 0.0 0.0 0.0 0.0     Respiratory Indices      Total Events REM NREM All Night  pRDI:  99   pAHI:  61 ODI:  10  pAHIc:  0  % CSR: 0.0 19.3 16.7 1.8 0.0 12.7 6.9 1.3 0.0 13.7 8.5 1.4 0.0       Pulse Rate Statistics during Sleep (BPM)      Mean: 57 Minimum: 42 Maximum: 83    Indices are calculated using technically valid sleep time of  7 hrs, 12 min. Central-Indices are calculated using technically valid sleep time of  6  hrs, 17 min. pRDI/pAHI are calculated using oxi desaturations ? 3%  Body Position Statistics  Position Supine Prone Right Left Non-Supine  Sleep (min) 21.0 90.5 142.0 181.0 413.5  Sleep % 4.8 20.8 32.7 41.7 95.2  pRDI 14.4 12.6 15.4 13.0 13.7  pAHI 14.4 11.3 6.4 8.0 8.2  ODI 2.9 2.0 0.9 1.3 1.3     Snoring Statistics Snoring Level (dB) >40 >50 >60 >70 >80 >Threshold (45)  Sleep (min) 240.6 51.0 4.1 0.3 0.0 113.8  Sleep % 55.4 11.7 0.9 0.1 0.0 26.2    Mean: 44 dB Sleep Stages Chart

## 2019-10-14 DIAGNOSIS — Z23 Encounter for immunization: Secondary | ICD-10-CM | POA: Diagnosis not present

## 2019-10-17 DIAGNOSIS — Z125 Encounter for screening for malignant neoplasm of prostate: Secondary | ICD-10-CM | POA: Diagnosis not present

## 2019-10-31 DIAGNOSIS — R42 Dizziness and giddiness: Secondary | ICD-10-CM | POA: Diagnosis not present

## 2019-10-31 DIAGNOSIS — I10 Essential (primary) hypertension: Secondary | ICD-10-CM | POA: Diagnosis not present

## 2019-10-31 DIAGNOSIS — J309 Allergic rhinitis, unspecified: Secondary | ICD-10-CM | POA: Diagnosis not present

## 2019-12-08 DIAGNOSIS — H8110 Benign paroxysmal vertigo, unspecified ear: Secondary | ICD-10-CM | POA: Diagnosis not present

## 2019-12-15 DIAGNOSIS — H8112 Benign paroxysmal vertigo, left ear: Secondary | ICD-10-CM | POA: Diagnosis not present

## 2019-12-18 DIAGNOSIS — H8112 Benign paroxysmal vertigo, left ear: Secondary | ICD-10-CM | POA: Diagnosis not present

## 2020-03-04 DIAGNOSIS — Z923 Personal history of irradiation: Secondary | ICD-10-CM | POA: Diagnosis not present

## 2020-03-04 DIAGNOSIS — Z8546 Personal history of malignant neoplasm of prostate: Secondary | ICD-10-CM | POA: Diagnosis not present

## 2020-06-28 ENCOUNTER — Telehealth: Payer: Self-pay

## 2020-06-28 NOTE — Telephone Encounter (Signed)
Pt left a VM asking to r/s his upcoming appt.

## 2020-06-29 NOTE — Telephone Encounter (Signed)
Pt is scheduled for 07/28/20. Will call to get rescheduled once I have a moment.

## 2020-06-29 NOTE — Telephone Encounter (Signed)
I called pt. I rescheduled his appt for 08/12/2020 at 9:30 am, check in at 9am. Pt verbalized understanding of new appt date and time.

## 2020-07-06 DIAGNOSIS — Z1211 Encounter for screening for malignant neoplasm of colon: Secondary | ICD-10-CM | POA: Diagnosis not present

## 2020-07-06 DIAGNOSIS — D125 Benign neoplasm of sigmoid colon: Secondary | ICD-10-CM | POA: Diagnosis not present

## 2020-07-06 DIAGNOSIS — K6289 Other specified diseases of anus and rectum: Secondary | ICD-10-CM | POA: Diagnosis not present

## 2020-07-06 DIAGNOSIS — D120 Benign neoplasm of cecum: Secondary | ICD-10-CM | POA: Diagnosis not present

## 2020-07-06 DIAGNOSIS — K635 Polyp of colon: Secondary | ICD-10-CM | POA: Diagnosis not present

## 2020-07-06 DIAGNOSIS — Z86010 Personal history of colonic polyps: Secondary | ICD-10-CM | POA: Diagnosis not present

## 2020-07-06 DIAGNOSIS — K5730 Diverticulosis of large intestine without perforation or abscess without bleeding: Secondary | ICD-10-CM | POA: Diagnosis not present

## 2020-07-06 DIAGNOSIS — D123 Benign neoplasm of transverse colon: Secondary | ICD-10-CM | POA: Diagnosis not present

## 2020-07-22 DIAGNOSIS — R7301 Impaired fasting glucose: Secondary | ICD-10-CM | POA: Diagnosis not present

## 2020-07-22 DIAGNOSIS — E7849 Other hyperlipidemia: Secondary | ICD-10-CM | POA: Diagnosis not present

## 2020-07-28 ENCOUNTER — Ambulatory Visit: Payer: Medicare Other | Admitting: Neurology

## 2020-07-29 DIAGNOSIS — R82998 Other abnormal findings in urine: Secondary | ICD-10-CM | POA: Diagnosis not present

## 2020-07-29 DIAGNOSIS — H8112 Benign paroxysmal vertigo, left ear: Secondary | ICD-10-CM | POA: Diagnosis not present

## 2020-07-29 DIAGNOSIS — Z0000 Encounter for general adult medical examination without abnormal findings: Secondary | ICD-10-CM | POA: Diagnosis not present

## 2020-07-29 DIAGNOSIS — Z8546 Personal history of malignant neoplasm of prostate: Secondary | ICD-10-CM | POA: Diagnosis not present

## 2020-07-29 DIAGNOSIS — E669 Obesity, unspecified: Secondary | ICD-10-CM | POA: Diagnosis not present

## 2020-07-29 DIAGNOSIS — R7301 Impaired fasting glucose: Secondary | ICD-10-CM | POA: Diagnosis not present

## 2020-07-29 DIAGNOSIS — G4733 Obstructive sleep apnea (adult) (pediatric): Secondary | ICD-10-CM | POA: Diagnosis not present

## 2020-07-29 DIAGNOSIS — J309 Allergic rhinitis, unspecified: Secondary | ICD-10-CM | POA: Diagnosis not present

## 2020-07-29 DIAGNOSIS — E785 Hyperlipidemia, unspecified: Secondary | ICD-10-CM | POA: Diagnosis not present

## 2020-07-29 DIAGNOSIS — I10 Essential (primary) hypertension: Secondary | ICD-10-CM | POA: Diagnosis not present

## 2020-07-29 DIAGNOSIS — Z86010 Personal history of colonic polyps: Secondary | ICD-10-CM | POA: Diagnosis not present

## 2020-07-29 DIAGNOSIS — R42 Dizziness and giddiness: Secondary | ICD-10-CM | POA: Diagnosis not present

## 2020-07-29 DIAGNOSIS — E291 Testicular hypofunction: Secondary | ICD-10-CM | POA: Diagnosis not present

## 2020-08-10 ENCOUNTER — Encounter: Payer: Self-pay | Admitting: Neurology

## 2020-08-12 ENCOUNTER — Encounter: Payer: Self-pay | Admitting: Neurology

## 2020-08-12 ENCOUNTER — Ambulatory Visit (INDEPENDENT_AMBULATORY_CARE_PROVIDER_SITE_OTHER): Payer: Medicare Other | Admitting: Neurology

## 2020-08-12 VITALS — BP 132/78 | HR 60 | Ht 71.0 in | Wt 214.0 lb

## 2020-08-12 DIAGNOSIS — H8302 Labyrinthitis, left ear: Secondary | ICD-10-CM

## 2020-08-12 DIAGNOSIS — G4733 Obstructive sleep apnea (adult) (pediatric): Secondary | ICD-10-CM

## 2020-08-12 DIAGNOSIS — Z9989 Dependence on other enabling machines and devices: Secondary | ICD-10-CM | POA: Diagnosis not present

## 2020-08-12 NOTE — Patient Instructions (Signed)

## 2020-08-12 NOTE — Progress Notes (Signed)
[facility]  Provider:  Larey Seat, M D  Referring Provider: Marton Redwood, MD Primary Care Physician:  Marton Redwood, MD  Chief Complaint  Patient presents with  . Follow-up    pt alone, rm 10. presents today for yearly follow up. wants to discuss concerns he is having with his mask. states the one that was ordered to try doesnt work and so he went back to the one he has used for years (2015/16) states that overtime he has devleoped an irritation to the skin from that mask. he wanted to also bring to Dr Jameka Ivie's atn that >8 mths dizziness concerns which are now noticeable in his gait. he has completed epley manueuver excercises.PCP referred him to ENT, Dr Redmond Baseman   . Other    states he finds he is now struggling with uneven surfaces and having to be more cautious     08-12-2020- Jeremiah Holt is a 69 y.o. male , who iwas seen here as a referral from Dr. Brigitte Pulse for a re-evaluation of sleep apnea. He is now due to have a new CPAP machine and cleaned the current one with an ozone generating device. We discussed the recall- he doesn't use a phillips device, but even [employer] want's to discontinue the ozone cleaning devices.  He needs to return to hand washing by water, baby soap, and vinegar.  Jeremiah Holt also has a need for a new mask he has tried and F3 TRI also known as the DreamWear full facemask.  This is a mask that covers the mouth under the nose his nostrils have been aggravated the skin has been irritated and he would like to alternate these with a triangular full facemask such as the Simplus.  I will be happy to provide that for him he can be fitted through the DME.  His current compliance is excellent 97% by days and 93% of CPAP use for hours per day with an average use at time of 6 hours 55 minutes.  He is using an AutoSet with a minimum pressure of 5 maximum pressure of 10 cmH2O on 3 cmH2O expiratory pressure relief.  Residual AHI is 1.4, speaking for an excellent  resolution of apnea he does not have major air leakage and his 95th percentile pressure was at 9 cm water.  There also no central apneas arising.  His Epworth sleepiness score was endorsed at 3 and fatigue severity at 23 points both are well below the beginning levels.  The geriatric depression score was endorsed at 1 point out of 15 no clinical indication for depression is present.  HST from 06-30-2019: Mild overall obstructive sleep apnea at AHI 8.5/h with REM exacerbation to 16.7/h. Intermittent bradycardic heart rate, no prolonged or clinically relevant hypoxia. Sleep apnea was accentuated by supine and prone sleep position.   He has reduced his HbA1c and HTN- and attributed this to exercises and CPAP use. I will write for a new machine.   8 month ago he had a vertigo spell in the shower, has done [place] and Dr Brigitte Pulse sent him to [facility].        HPI:  The patient is of [place], raised in the free [place], was referred to a PSG at the [facility] in 2013, was diagnosed with OSA and returned for a CPA titration.  He was not satisfied with the test facility and the way he received results. He couldn't sleep in the lab and was fitted with a FFM  which caused pressure marks. He is here because of recent changes in fatigue and well being , just this March 2015 he became extremely fatigued, irritable and depressed. He sees himself as energetic and was always very energetic at work until this spring. He continued to snore , his apnea remained untreated since diagnosis.  He has described spells of sleep choking, causing a fear of suffocation. His first time in October 2013 and now again, he had to ask his wife to hold him up, allowing him to breath deep enough to calm down.this also caused anxiety in other aspects of his life, his work productivity is declined, his children noted during vacation. Little things now make him anxious.  He would consider himself a habitual mouth  breather. He has some allergic rhinitis. His wife reports snoring, when supine not in a recliner.   He goes to bed between 10.00 PM and shortly after goes to sleep. He likes to listen to an audio book with headphones. He rises at 7 AM, spontaneously.  He ha no nocturia but wakes 2-4 times , variable duration. No headaches , no pain. He wakes in general not feeling refreshed or restored. If he takes a nap in the afternoon, after lunch  And he wakes much more refreshed, after 30-45 minutes. Naps do not affect his nocturnal sleep. He remembers complex dreams, long, detailed, vivid. No dream acting out. No RLS, no pain.  He prefers prone sleep. 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 There is no family history of sleep apnea. His father was napping frequently.  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 He has become a believer in CPAP use, after being highly sceptical the first month. Now, he stated : "I would pay for it". His sleep study was performed in August by [facility] protocol.  His residual AHI is 3.5/hr.  Hourly use, 8 hours 37 minutes, 97%.   10-27-15 Interval history -  The patient reports that for the first year that he used CPAP at the results were remarkable but for the last 2 months he has felt again fatigued. His compliance record shows that he uses the machine 100% of the time all days over 4 hours. Average user time 7 hours 1 minute, set pressure 6 cm water was 1 cm EPR and the residual AHI is 2.0. All these are very desirable results. There is no major air leak noted. He endorsed today the Epworth sleepiness score at 4 and the fatigue severity score at 40 2., the fatigue score clearly has risen. An overnight pulse oximetry have been obtained through his primary care physician showing that the patient had a stable heart rate throughout the night and that he did not have significant or prolonged desaturations. The baseline oxygen  saturation was 93.7% and the time less then 88% saturation was 1.2 minutes. He would not be qualifying for additional oxygen. He was able to reduce the blood pressure medicine dose by health. By end of August  he felt an increasing fatigue again and now takes a half hour nap each day , which refreshes him.  10-26-2016  Spends more time in [facility], teaches at [employer], and keeps busy. Jeremiah Holt still travels a lot but mostly now within the [place]. He would like a trouble friendly smaller CPAP machine that allows packing an overnight bag. He has been 100% compliant CPAP user has used the machine for 4  consecutive hours on 29 of the last 30 days, average user time 6 hours 22 minutes CPAP still set at 6 cm water was 1 cm EPR and residual apnea is 2.6. He uses the machine before midnight. No fatigue.  Nasal pillow.   01-15-2018, I have the pleasure of seeing Jeremiah Holt today who has been a compliant CPAP user for many years.  Today's download is not different with 97% compliance and average of 6 hours and 49 minutes of daily CPAP use residual AHI of only 2.0/h.  There are some air leaks but there are moderate, 95th percentile pressure is 9.5 cm on an AutoSet with a pressure window between 5 and 10 cmH2O and 3 cm EPR. The patient uses a nasal pillow at this time which sometimes causes him to itch.  He is interested in changing or having the alternative use of a full facemask.  I will ask [facility] to fit him for such a mask.  His Epworth sleepiness score is endorsed at only 3 points, fatigue severity at 18, geriatric depression at 2 out of 15 points.    Review of Systems: Out of a complete 14 system review, the patient complains of only the following symptoms, and all other reviewed systems are negative.  The patient endorsed the Epworth sleepiness score at 3 points. FSS 18, GDS 2 points.   How likely are you to doze in the following situations: 0 = not likely, 1 = slight chance, 2 = moderate  chance, 3 = high chance  Sitting and Reading? Watching Television? Sitting inactive in a public place (theater or meeting)? Lying down in the afternoon when circumstances permit? Sitting and talking to someone? Sitting quietly after lunch without alcohol? In a car, while stopped for a few minutes in traffic? As a passenger in a car for an hour without a break?  Total = 3  New onset lightheadedness when getting up out of bed or from a chair.  Left sided nystagmus, 3 beats to the left.      Social History   Socioeconomic History  . Marital status: Married    Spouse name: Jeremiah Holt  . Number of children: 2  . Years of education: [name]  . Highest education level: Not on file  Occupational History  . Not on file  Tobacco Use  . Smoking status: Former Smoker    Types: Cigars  . Smokeless tobacco: Never Used  Substance and Sexual Activity  . Alcohol use: Yes    Comment: 1-2 glasses of wine daily  . Drug use: No  . Sexual activity: Not on file  Other Topics Concern  . Not on file  Social History Narrative   Patient is married (Jeremiah Holt) and lives at home with his wife.   Patient has two adult children.   Patient has a Financial risk analyst.   Patient is right-handed.   Patient drinks three cups of coffee daily.   Social Determinants of Health   Financial Resource Strain:   . Difficulty of Paying Living Expenses: Not on file  Food Insecurity:   . Worried About Charity fundraiser in the Last Year: Not on file  . Ran Out of Food in the Last Year: Not on file  Transportation Needs:   . Lack of Transportation (Medical): Not on file  . Lack of Transportation (Non-Medical): Not on file  Physical Activity:   . Days of Exercise per Week: Not on file  . Minutes of Exercise per Session: Not  on file  Stress:   . Feeling of Stress : Not on file  Social Connections:   . Frequency of Communication with Friends and Family: Not on file  . Frequency of Social Gatherings with Friends  and Family: Not on file  . Attends Religious Services: Not on file  . Active Member of Clubs or Organizations: Not on file  . Attends Archivist Meetings: Not on file  . Marital Status: Not on file  Intimate Partner Violence:   . Fear of Current or Ex-Partner: Not on file  . Emotionally Abused: Not on file  . Physically Abused: Not on file  . Sexually Abused: Not on file    Family History  Problem Relation Age of Onset  . Lymphoma Father   . Hypertension Father   . Prostate cancer Father   . Heart attack Father 61  . Congestive Heart Failure Mother   . Hypertension Mother     Past Medical History:  Diagnosis Date  . Cancer ([place])   . Fatigue   . Hearing loss   . History of echocardiogram    Echo 11/17: EF 55-60, normal wall motion, Gr 1 diastolic dysfunction, mildly dilated aortic root and ascending aorta (Ao root 40/ascending aorta 38), MAC  . History of nuclear stress test    Myoview 11/17:  Ex 9'; no ischemia or infarct, HTN response to stress with peak BP 192/75; Low Risk  . Hypertension     No past surgical history on file.  Current Outpatient Medications  Medication Sig Dispense Refill  . aspirin 81 MG tablet Take 81 mg by mouth daily.    Marland Kitchen atorvastatin (LIPITOR) 40 MG tablet Take 40 mg by mouth daily.     . cetirizine (ZYRTEC) 10 MG tablet Take 10 mg by mouth daily as needed.     . GUAIFENESIN PO Take 1 tablet by mouth at bedtime. 400 mg-6102m    . losartan (COZAAR) 50 MG tablet Take 50 mg by mouth daily.     No current facility-administered medications for this visit.    Allergies as of 08/12/2020 - Review Complete 08/12/2020  Allergen Reaction Noted  . Efudex [fluorouracil] Dermatitis 10/26/2014    Vitals: BP 132/78   Pulse 60   Ht _0  (1.803 m)   Wt 214 lb (97.1 kg)   BMI 29.85 kg/m  Last Weight:  Wt Readings from Last 1 Encounters:  08/12/20 214 lb (97.1 kg)   Last Height:   Ht Readings from Last 1 Encounters:  08/12/20 _1   (1.803 m)   Physical exam: General: The patient is awake, alert and appears not in acute distress. The patient is well groomed. Head: Normocephalic, atraumatic. Neck is supple. Mallampati 4 ,all natural teeth.   neck circumference: 17. 45." with a double chin.  Cardiovascular:  Regular rate and rhythm , without  murmurs or carotid bruit, and without distended neck veins. Respiratory: Lungs are clear to auscultation. Skin:  Without evidence of edema, or rash, rosacea . Trunk: BMI 30. 82 ,normal posture.  Neurologic exam : The patient is awake and alert, oriented to place and time. Memory subjective described as intact. There is a normal attention span & concentration ability.  Cranial nerves:No change of smell or taste. Extraocular movements  in vertical and horizontal planes intact and without nystagmus. After vigorous head movements with closed eyes we note left sided nystagmus, 3 beats to the left. Posterior vestibular system on the left.   Visual fields by finger  perimetry are intact. Hearing to finger rub intact. Facial motor strength is symmetric and tongue and uvula move midline. Finger-to-nose maneuver tested and normal without evidence of ataxia, dysmetria or tremor.   There is a symmetric muscle bulk, tone, full relaxation but I noticed a mild cogwheeling over both biceps, nothing at the wrist.  Sensory exam is normal to vibration by tuning fork exam and fine touch, ankle medial and laterally are able to feel distinctive vibration from the instrument.  Assessment:  30 minute visit time    After physical and neurologic examination, review of laboratory studies, imaging, neurophysiology testing and pre-existing records, Dr Raul Del orthostatic BP and heart rate.   Plan:  Treatment plan and additional workup :  1)Continue OSA treatment with CPAP, auto set 5-15. change to FFM per patients request.  We will write a new machine and new mask, Simplus.   2) lightheadedness is more  Vertigo- left posterior / no evidence of neuropathy and apparently orthostatics were normal.  3) shared with Dr.Bates.       Larey Seat, MD  Cc Dr Brigitte Pulse,

## 2020-08-18 ENCOUNTER — Encounter: Payer: Self-pay | Admitting: Neurology

## 2020-09-08 DIAGNOSIS — Z23 Encounter for immunization: Secondary | ICD-10-CM | POA: Diagnosis not present

## 2020-09-26 DIAGNOSIS — Z23 Encounter for immunization: Secondary | ICD-10-CM | POA: Diagnosis not present

## 2020-09-30 DIAGNOSIS — H8110 Benign paroxysmal vertigo, unspecified ear: Secondary | ICD-10-CM | POA: Diagnosis not present

## 2020-09-30 DIAGNOSIS — R609 Edema, unspecified: Secondary | ICD-10-CM | POA: Diagnosis not present

## 2020-11-25 ENCOUNTER — Ambulatory Visit: Payer: Medicare Other | Admitting: Neurology

## 2020-11-30 DIAGNOSIS — R42 Dizziness and giddiness: Secondary | ICD-10-CM | POA: Diagnosis not present

## 2021-03-16 DIAGNOSIS — Z8546 Personal history of malignant neoplasm of prostate: Secondary | ICD-10-CM | POA: Diagnosis not present

## 2021-03-16 DIAGNOSIS — Z923 Personal history of irradiation: Secondary | ICD-10-CM | POA: Diagnosis not present

## 2021-03-31 DIAGNOSIS — M5136 Other intervertebral disc degeneration, lumbar region: Secondary | ICD-10-CM | POA: Diagnosis not present

## 2021-04-11 DIAGNOSIS — Z23 Encounter for immunization: Secondary | ICD-10-CM | POA: Diagnosis not present

## 2021-06-15 DIAGNOSIS — D485 Neoplasm of uncertain behavior of skin: Secondary | ICD-10-CM | POA: Diagnosis not present

## 2021-06-15 DIAGNOSIS — L570 Actinic keratosis: Secondary | ICD-10-CM | POA: Diagnosis not present

## 2021-06-15 DIAGNOSIS — D225 Melanocytic nevi of trunk: Secondary | ICD-10-CM | POA: Diagnosis not present

## 2021-06-15 DIAGNOSIS — L821 Other seborrheic keratosis: Secondary | ICD-10-CM | POA: Diagnosis not present

## 2021-06-15 DIAGNOSIS — B351 Tinea unguium: Secondary | ICD-10-CM | POA: Diagnosis not present

## 2021-06-15 DIAGNOSIS — C44622 Squamous cell carcinoma of skin of right upper limb, including shoulder: Secondary | ICD-10-CM | POA: Diagnosis not present

## 2021-06-15 DIAGNOSIS — D1801 Hemangioma of skin and subcutaneous tissue: Secondary | ICD-10-CM | POA: Diagnosis not present

## 2021-06-15 DIAGNOSIS — C44629 Squamous cell carcinoma of skin of left upper limb, including shoulder: Secondary | ICD-10-CM | POA: Diagnosis not present

## 2021-06-15 DIAGNOSIS — B078 Other viral warts: Secondary | ICD-10-CM | POA: Diagnosis not present

## 2021-06-28 DIAGNOSIS — H35373 Puckering of macula, bilateral: Secondary | ICD-10-CM | POA: Diagnosis not present

## 2021-06-28 DIAGNOSIS — H2513 Age-related nuclear cataract, bilateral: Secondary | ICD-10-CM | POA: Diagnosis not present

## 2021-06-28 DIAGNOSIS — H5213 Myopia, bilateral: Secondary | ICD-10-CM | POA: Diagnosis not present

## 2021-08-09 DIAGNOSIS — R7301 Impaired fasting glucose: Secondary | ICD-10-CM | POA: Diagnosis not present

## 2021-08-09 DIAGNOSIS — E785 Hyperlipidemia, unspecified: Secondary | ICD-10-CM | POA: Diagnosis not present

## 2021-08-09 DIAGNOSIS — Z125 Encounter for screening for malignant neoplasm of prostate: Secondary | ICD-10-CM | POA: Diagnosis not present

## 2021-08-09 DIAGNOSIS — E291 Testicular hypofunction: Secondary | ICD-10-CM | POA: Diagnosis not present

## 2021-08-10 DIAGNOSIS — H903 Sensorineural hearing loss, bilateral: Secondary | ICD-10-CM | POA: Diagnosis not present

## 2021-08-16 DIAGNOSIS — I10 Essential (primary) hypertension: Secondary | ICD-10-CM | POA: Diagnosis not present

## 2021-08-16 DIAGNOSIS — R1031 Right lower quadrant pain: Secondary | ICD-10-CM | POA: Diagnosis not present

## 2021-08-16 DIAGNOSIS — R82998 Other abnormal findings in urine: Secondary | ICD-10-CM | POA: Diagnosis not present

## 2021-08-16 DIAGNOSIS — Z23 Encounter for immunization: Secondary | ICD-10-CM | POA: Diagnosis not present

## 2021-08-16 DIAGNOSIS — R3915 Urgency of urination: Secondary | ICD-10-CM | POA: Diagnosis not present

## 2021-08-16 DIAGNOSIS — Z1331 Encounter for screening for depression: Secondary | ICD-10-CM | POA: Diagnosis not present

## 2021-08-16 DIAGNOSIS — Z0000 Encounter for general adult medical examination without abnormal findings: Secondary | ICD-10-CM | POA: Diagnosis not present

## 2021-08-16 DIAGNOSIS — Z8546 Personal history of malignant neoplasm of prostate: Secondary | ICD-10-CM | POA: Diagnosis not present

## 2021-08-16 DIAGNOSIS — Z86010 Personal history of colonic polyps: Secondary | ICD-10-CM | POA: Diagnosis not present

## 2021-08-16 DIAGNOSIS — Z1389 Encounter for screening for other disorder: Secondary | ICD-10-CM | POA: Diagnosis not present

## 2021-08-16 DIAGNOSIS — E785 Hyperlipidemia, unspecified: Secondary | ICD-10-CM | POA: Diagnosis not present

## 2021-08-16 DIAGNOSIS — R7301 Impaired fasting glucose: Secondary | ICD-10-CM | POA: Diagnosis not present

## 2021-08-16 DIAGNOSIS — E669 Obesity, unspecified: Secondary | ICD-10-CM | POA: Diagnosis not present

## 2021-09-28 ENCOUNTER — Encounter: Payer: Self-pay | Admitting: Neurology

## 2021-10-03 ENCOUNTER — Encounter: Payer: Self-pay | Admitting: Neurology

## 2021-10-03 ENCOUNTER — Ambulatory Visit (INDEPENDENT_AMBULATORY_CARE_PROVIDER_SITE_OTHER): Payer: Medicare Other | Admitting: Neurology

## 2021-10-03 ENCOUNTER — Other Ambulatory Visit: Payer: Self-pay

## 2021-10-03 VITALS — BP 154/78 | HR 74 | Ht 71.0 in | Wt 217.5 lb

## 2021-10-03 DIAGNOSIS — G4733 Obstructive sleep apnea (adult) (pediatric): Secondary | ICD-10-CM | POA: Diagnosis not present

## 2021-10-03 DIAGNOSIS — Z9989 Dependence on other enabling machines and devices: Secondary | ICD-10-CM | POA: Diagnosis not present

## 2021-10-03 DIAGNOSIS — F428 Other obsessive-compulsive disorder: Secondary | ICD-10-CM

## 2021-10-03 DIAGNOSIS — R413 Other amnesia: Secondary | ICD-10-CM

## 2021-10-03 DIAGNOSIS — H8302 Labyrinthitis, left ear: Secondary | ICD-10-CM | POA: Insufficient documentation

## 2021-10-03 MED ORDER — CITALOPRAM HYDROBROMIDE 10 MG PO TABS: 10.0000 mg | ORAL_TABLET | Freq: Every day | ORAL | 5 refills | Status: DC

## 2021-10-03 NOTE — Progress Notes (Signed)
[facility]  Provider:  Larey Seat, M D  Referring Provider: Ginger Organ., MD Primary Care Physician:  Ginger Organ., MD  Chief Complaint  Patient presents with   Obstructive Sleep Apnea    Rm 11, alone. Here for yearly CPAP f/u. Pt reports doing well on CPAP. Pt has a few quations regarding new technology for OSA. Pt would like to discuss his anxiety. Pt reports new concerns with his balance.      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 His HTN is well controlled, he is complaint with CPAP and uses a nasal mask Airfit N 2. ResMed. He is due for new device.  He is not officially retired and is no longer new cases. He reports some anxiety recently. He is more worried, more anxious , and always concerned worried. He has a touch of OCD. He feels that his cognitive function, alertness in mornings is not as good in AM.  He is worried about dementia, 2 siblings with dementia. Mother died of dementia,  father had no cognitive concerns. He spends a lot of time on [place]. Has sometimes vertigo- vestibular rehab and treatment worked for his vertigo, he had some impairment of peripheral vision. Impaired visual spatial sense.   He is willing to try an inspire , but this mid AHI would not justify a surgical procedure.       08-12-2020-referral from Dr. Brigitte Pulse for a re-evaluation of sleep apnea. He is now due to have a new CPAP machine and cleaned the current one with an ozone generating device. We discussed the recall- he doesn't use a phillips device, but even ResMed want's to discontinue the ozone cleaning devices.  He needs to return to hand washing by water, baby soap, and vinegar.  [name] also has a need for a new mask he has tried and F3 TRI also known as the DreamWear full facemask.  This is  a mask that covers the mouth under the nose his nostrils have been aggravated the skin has been irritated and he would like to alternate these with a triangular full facemask such as the Simplus.  I will be happy to provide that for him he can be fitted through the DME.  His current compliance is excellent 97% by days and 93% of CPAP use for hours per day with an average use at time of 6 hours 55 minutes.  He is using an AutoSet with a minimum pressure of 5 maximum pressure of 10 cmH2O on 3 cmH2O expiratory pressure relief.  Residual AHI is 1.4, speaking for an excellent resolution of apnea he does not have major air leakage and his 95th percentile pressure was at 9 cm water.  There also no central apneas arising.  His Epworth sleepiness score was endorsed at 3 and fatigue severity at 23 points both are well below the beginning levels.  The geriatric depression score was endorsed at 1 point out of 15 no clinical indication for depression is present.  HST from 06-30-2019: Mild overall obstructive sleep apnea at AHI 8.5/h with REM exacerbation to 16.7/h. Intermittent bradycardic heart rate, no prolonged or clinically relevant hypoxia. Sleep apnea was accentuated by supine and prone sleep position.   He has reduced his HbA1c and HTN- and  attributed this to exercises and CPAP use. I will write for a new machine.   8 month ago he had a vertigo spell in the shower, has done [place] and Dr Brigitte Pulse sent him to [facility].         HPI:  The patient is of [place], raised in the free [place], was referred to a PSG at the [facility] in 2013, was diagnosed with OSA and returned for a CPA titration.  He was not satisfied with the test facility and the way he received results. He couldn't sleep in the lab and was fitted with a FFM which caused pressure marks. He is here because of recent changes in fatigue and well being , just this March 2015 he became extremely fatigued, irritable and  depressed. He sees himself as energetic and was always very energetic at work until this spring. He continued to snore , his apnea remained untreated since diagnosis.  He has described spells of sleep choking, causing a fear of suffocation. His first time in October 2013 and now again, he had to ask his wife to hold him up, allowing him to breath deep enough to calm down.this also caused anxiety in other aspects of his life, his work productivity is declined, his children noted during vacation. Little things now make him anxious.  He would consider himself a habitual mouth breather. He has some allergic rhinitis. His wife reports snoring, when supine not in a recliner.   He goes to bed between 10.00 PM and shortly after goes to sleep. He likes to listen to an audio book with headphones. He rises at 7 AM, spontaneously.  He ha no nocturia but wakes 2-4 times , variable duration. No headaches , no pain. He wakes in general not feeling refreshed or restored. If he takes a nap in the afternoon, after lunch  And he wakes much more refreshed, after 30-45 minutes. Naps do not affect his nocturnal sleep. He remembers complex dreams, long, detailed, vivid. No dream acting out. No RLS, no pain.  He prefers prone sleep. 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 There is no family history of sleep apnea. His father was napping frequently.  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 He has become a believer in CPAP use, after being highly sceptical the first month. Now, he stated : "I would pay for it". His sleep study was performed in August by SPLIT protocol.  His residual AHI is 3.5/hr.  Hourly use, 8 hours 37 minutes, 97%.   10-27-15 Interval history -  The patient reports that for the first year that he used CPAP at the results were remarkable but for the last 2 months he has felt again fatigued. His compliance record shows that he uses the machine  100% of the time all days over 4 hours. Average user time 7 hours 1 minute, set pressure 6 cm water was 1 cm EPR and the residual AHI is 2.0. All these are very desirable results. There is no major air leak noted. He endorsed today the Epworth sleepiness score at 4 and the fatigue severity score at 40 2., the fatigue score clearly has risen. An overnight pulse oximetry have been obtained through his primary care physician showing that the patient had a stable heart rate throughout the night and that he did not have significant or prolonged desaturations. The baseline oxygen saturation was 93.7%  and the time less then 88% saturation was 1.2 minutes. He would not be qualifying for additional oxygen. He was able to reduce the blood pressure medicine dose by health. By end of August  he felt an increasing fatigue again and now takes a half hour nap each day , which refreshes him.  10-26-2016  Spends more time in [facility], teaches at [employer], and keeps busy. Mr. Dirk still travels a lot but mostly now within the [place]. He would like a trouble friendly smaller CPAP machine that allows packing an overnight bag. He has been 100% compliant CPAP user has used the machine for 4 consecutive hours on 29 of the last 30 days, average user time 6 hours 22 minutes CPAP still set at 6 cm water was 1 cm EPR and residual apnea is 2.6. He uses the machine before midnight. No fatigue.  Nasal pillow.   01-15-2018, I have the pleasure of seeing Mr. Antonelli today who has been a compliant CPAP user for many years.  Today's download is not different with 97% compliance and average of 6 hours and 49 minutes of daily CPAP use residual AHI of only 2.0/h.  There are some air leaks but there are moderate, 95th percentile pressure is 9.5 cm on an AutoSet with a pressure window between 5 and 10 cmH2O and 3 cm EPR. The patient uses a nasal pillow at this time which sometimes causes him to itch.  He is interested in changing or  having the alternative use of a full facemask.  I will ask aero care to fit him for such a mask.  His Epworth sleepiness score is endorsed at only 3 points, fatigue severity at 18, geriatric depression at 2 out of 15 points.    Review of Systems: Out of a complete 14 system review, the patient complains of only the following symptoms, and all other reviewed systems are negative.  The patient endorsed the Epworth sleepiness score at 3 points. FSS 18, GDS 2 points  OCD- starting on lexapro.   Balance  training :  standing on one leg , every day once a time.   How likely are you to doze in the following situations: 0 = not likely, 1 = slight chance, 2 = moderate chance, 3 = high chance  Sitting and Reading? Watching Television? Sitting inactive in a public place (theater or meeting)? Lying down in the afternoon when circumstances permit? Sitting and talking to someone? Sitting quietly after lunch without alcohol? In a car, while stopped for a few minutes in traffic? As a passenger in a car for an hour without a break?  Total = 3  New onset lightheadedness when getting up out of bed or from a chair.  Left sided nystagmus, 3 beats to the left.    GDS 1 / 15 . OCD   Social History   Socioeconomic History   Marital status: Married    Spouse name: [name]   Number of children: 2   Years of education: Adult nurse   Highest education level: Not on file  Occupational History   Not on file  Tobacco Use   Smoking status: Former    Types: Cigars   Smokeless tobacco: Never  Substance and Sexual Activity   Alcohol use: Yes    Comment: 1-2 glasses of wine daily   Drug use: No   Sexual activity: Not on file  Other Topics Concern   Not on file  Social History Narrative   Patient is  married ([name]) and lives at home with his wife.   Patient has two adult children.   Patient has a Financial risk analyst.   Patient is right-handed.   Patient drinks three cups of coffee daily.   Social  Determinants of Health   Financial Resource Strain: Not on file  Food Insecurity: Not on file  Transportation Needs: Not on file  Physical Activity: Not on file  Stress: Not on file  Social Connections: Not on file  Intimate Partner Violence: Not on file    Family History  Problem Relation Age of Onset   Lymphoma Father    Hypertension Father    Prostate cancer Father    Heart attack Father 86   Congestive Heart Failure Mother    Hypertension Mother     Past Medical History:  Diagnosis Date   Cancer ([place])    Fatigue    Hearing loss    History of echocardiogram    Echo 11/17: EF 55-60, normal wall motion, Gr 1 diastolic dysfunction, mildly dilated aortic root and ascending aorta (Ao root 40/ascending aorta 38), MAC   History of nuclear stress test    Myoview 11/17:  Ex 9'; no ischemia or infarct, HTN response to stress with peak BP 192/75; Low Risk   Hypertension     History reviewed. No pertinent surgical history.  Current Outpatient Medications  Medication Sig Dispense Refill   atorvastatin (LIPITOR) 40 MG tablet Take 40 mg by mouth daily.      cetirizine (ZYRTEC) 10 MG tablet Take 10 mg by mouth daily as needed.      GUAIFENESIN PO Take 1 tablet by mouth at bedtime. 400 mg-628m     losartan (COZAAR) 50 MG tablet Take 50 mg by mouth daily.     No current facility-administered medications for this visit.    Allergies as of 10/03/2021 - Review Complete 10/03/2021  Allergen Reaction Noted   Efudex [fluorouracil] Dermatitis 10/26/2014    Vitals: BP (!) 154/78   Pulse 74   Ht _0  (1.803 m)   Wt 217 lb 8 oz (98.7 kg)   BMI 30.34 kg/m  Last Weight:  Wt Readings from Last 1 Encounters:  10/03/21 217 lb 8 oz (98.7 kg)   Last Height:   Ht Readings from Last 1 Encounters:  10/03/21 _1  (1.803 m)   Physical exam: General: The patient is awake, alert and appears not in acute distress. The patient is well groomed. Head: Normocephalic, atraumatic. Neck is  supple. Mallampati 4 ,all natural teeth.   neck circumference: 17. 45." with a double chin.  Cardiovascular:  Regular rate and rhythm , without  murmurs or carotid bruit, and without distended neck veins. Respiratory: Lungs are clear to auscultation. Skin:  Without evidence of edema, or rash, rosacea . Trunk: BMI 30. 82 ,normal posture.  Neurologic exam : The patient is awake and alert, oriented to place and time. Memory subjective described as intact. There is a normal attention span & concentration ability.  Cranial nerves:No change of smell or taste. Extraocular movements  in vertical and horizontal planes intact and without nystagmus. After vigorous head movements with closed eyes we note left sided nystagmus, 3 beats to the left. Posterior vestibular system on the left.   Visual fields by finger perimetry are intact. Hearing to finger rub intact. Facial motor strength is symmetric and tongue and uvula move midline. Finger-to-nose maneuver tested and normal without evidence of ataxia, dysmetria or tremor.   There is a symmetric  muscle bulk, tone, full relaxation but I noticed a mild cogwheeling over both biceps, nothing at the wrist.  Sensory exam is normal to vibration by tuning fork exam and fine touch, ankle medial and laterally are able to feel distinctive vibration from the instrument.  Assessment:  30 minute visit time    After physical and neurologic examination, review of laboratory studies, imaging, neurophysiology testing and pre-existing records, Dr Raul Del orthostatic BP and heart rate.   Plan:  Treatment plan and additional workup :  1)Continue OSA treatment with CPAP, auto set 5-15. change to FFM per patients request.  We will write a new machine and new mask, with memory foam.   2) Next visit with [facility]OCA -  3) OCD, starting on Celexa.       Larey Seat, MD  Cc Dr Brigitte Pulse,

## 2021-10-03 NOTE — Patient Instructions (Signed)
Escitalopram Solution What is this medication? ESCITALOPRAM (es sye TAL oh pram) treats depression and anxiety. It increases the amount of serotonin in the brain, a hormone that helps regulate mood. It belongs to a group of medications called SSRIs. This medicine may be used for other purposes; ask your health care provider or pharmacist if you have questions. COMMON BRAND NAME(S): Lexapro What should I tell my care team before I take this medication? They need to know if you have any of these conditions: Bipolar disorder or a family history of bipolar disorder Diabetes Glaucoma Heart disease Kidney or liver disease Receiving electroconvulsive therapy Seizures Suicidal thoughts, plans, or attempt by you or a family member An unusual or allergic reaction to escitalopram, citalopram, other medications, foods, dyes, or preservatives Pregnant or trying to become pregnant Breast-feeding How should I use this medication? Take this medication by mouth. Follow the directions on the prescription label. Use a specially marked spoon or container to measure your medication. Ask your pharmacist if you do not have one. Household spoons are not accurate. This medication can be taken with or without food. Take your medication at regular intervals. Do not take it more often than directed. Do not stop taking this medication suddenly except upon the advice of your care team. Stopping this medication too quickly may cause serious side effects or your condition may worsen. A special MedGuide will be given to you by the pharmacist with each prescription and refill. Be sure to read this information carefully each time. Talk to your care team regarding the use of this medication in children. Special care may be needed. Overdosage: If you think you have taken too much of this medicine contact a poison control center or emergency room at once. NOTE: This medicine is only for you. Do not share this medicine with  others. What if I miss a dose? If you miss a dose, take it as soon as you can. If it is almost time for your next dose, take only that dose. Do not take double or extra doses. What may interact with this medication? Do not take this medication with any of the following: Certain medications for fungal infections like fluconazole, itraconazole, ketoconazole, posaconazole, voriconazole Cisapride Citalopram Dronedarone Linezolid MAOIs like Carbex, Eldepryl, Marplan, Nardil, and Parnate Methylene blue (injected into a vein) Pimozide Thioridazine This medication may also interact with the following: Alcohol Amphetamines Aspirin and aspirin-like medications Carbamazepine Certain medications for depression, anxiety, or psychotic disturbances Certain medications for migraine headache like almotriptan, eletriptan, frovatriptan, naratriptan, rizatriptan, sumatriptan, zolmitriptan Certain medications for sleep Certain medications that treat or prevent blood clots like warfarin, enoxaparin, and dalteparin Cimetidine Diuretics Dofetilide Fentanyl Furazolidone Isoniazid Lithium Metoprolol NSAIDs, medications for pain and inflammation, like ibuprofen or naproxen Other medications that prolong the QT interval (cause an abnormal heart rhythm) Procarbazine Rasagiline Supplements like St. John's wort, kava kava, valerian Tramadol Tryptophan Ziprasidone This list may not describe all possible interactions. Give your health care provider a list of all the medicines, herbs, non-prescription drugs, or dietary supplements you use. Also tell them if you smoke, drink alcohol, or use illegal drugs. Some items may interact with your medicine. What should I watch for while using this medication? Tell your care team if your symptoms do not get better or if they get worse. Visit your care team for regular checks on your progress. Because it may take several weeks to see the full effects of this medication,  it is important to continue your treatment as  prescribed by your care team. Watch for new or worsening thoughts of suicide or depression. This includes sudden changes in mood, behaviors, or thoughts. These changes can happen at any time but are more common in the beginning of treatment or after a change in dose. Call your care team right away if you experience these thoughts or worsening depression. Manic episodes may happen in patients with bipolar disorder who take this medication. Watch for changes in feelings or behaviors such as feeling anxious, nervous, agitated, panicky, irritable, hostile, aggressive, impulsive, severely restless, overly excited and hyperactive, or trouble sleeping. These symptoms can happen at any time but are more common in the beginning of treatment or after a change in dose. Call your care team right away if you notice any of these symptoms. You may get drowsy or dizzy. Do not drive, use machinery, or do anything that needs mental alertness until you know how this medication affects you. Do not stand or sit up quickly, especially if you are an older patient. This reduces the risk of dizzy or fainting spells. Alcohol may interfere with the effect of this medication. Avoid alcoholic drinks. Your mouth may get dry. Chewing sugarless gum or sucking hard candy, and drinking plenty of water may help. Contact your care team if the problem does not go away or is severe. What side effects may I notice from receiving this medication? Side effects that you should report to your care team as soon as possible: Allergic reactions-skin rash, itching, hives, swelling of the face, lips, tongue, or throat Bleeding-bloody or black, tar-like stools, red or dark brown urine, vomiting blood or brown material that looks like coffee grounds, small, red or purple spots on skin, unusual bleeding or bruising Heart rhythm changes-fast or irregular heartbeat, dizziness, feeling faint or lightheaded, chest  pain, trouble breathing Low sodium level-muscle weakness, fatigue, dizziness, headache, confusion Serotonin syndrome-irritability, confusion, fast or irregular heartbeat, muscle stiffness, twitching muscles, sweating, high fever, seizure, chills, vomiting, diarrhea Sudden eye pain or change in vision such as blurry vision, seeing halos around lights, vision loss Thoughts of suicide or self-harm, worsening mood, feelings of depression Side effects that usually do not require medical attention (report to your care team if they continue or are bothersome): Change in sex drive or performance Diarrhea Excessive sweating Nausea Tremors or shaking Upset stomach This list may not describe all possible side effects. Call your doctor for medical advice about side effects. You may report side effects to FDA at 1-800-FDA-1088. Where should I keep my medication? Keep out of reach of children and pets. Store at room temperature between 15 and 30 degrees C (59 and 86 degrees F). Throw away any unused medication after the expiration date. NOTE: This sheet is a summary. It may not cover all possible information. If you have questions about this medicine, talk to your doctor, pharmacist, or health care provider.  2022 Elsevier/Gold Standard (2020-10-25 09:37:28)

## 2021-10-13 DIAGNOSIS — R9431 Abnormal electrocardiogram [ECG] [EKG]: Secondary | ICD-10-CM | POA: Diagnosis not present

## 2021-10-13 DIAGNOSIS — R55 Syncope and collapse: Secondary | ICD-10-CM | POA: Diagnosis not present

## 2021-10-13 DIAGNOSIS — F411 Generalized anxiety disorder: Secondary | ICD-10-CM | POA: Diagnosis not present

## 2021-12-20 DIAGNOSIS — R9431 Abnormal electrocardiogram [ECG] [EKG]: Secondary | ICD-10-CM | POA: Diagnosis not present

## 2021-12-20 DIAGNOSIS — F411 Generalized anxiety disorder: Secondary | ICD-10-CM | POA: Diagnosis not present

## 2021-12-20 DIAGNOSIS — R55 Syncope and collapse: Secondary | ICD-10-CM | POA: Diagnosis not present

## 2021-12-20 DIAGNOSIS — I10 Essential (primary) hypertension: Secondary | ICD-10-CM | POA: Diagnosis not present

## 2022-02-07 ENCOUNTER — Ambulatory Visit: Payer: Medicare Other | Admitting: Neurology

## 2022-03-15 ENCOUNTER — Ambulatory Visit (INDEPENDENT_AMBULATORY_CARE_PROVIDER_SITE_OTHER): Payer: Medicare Other | Admitting: Neurology

## 2022-03-15 ENCOUNTER — Encounter: Payer: Self-pay | Admitting: Neurology

## 2022-03-15 VITALS — BP 149/81 | HR 71 | Ht 71.0 in | Wt 221.0 lb

## 2022-03-15 DIAGNOSIS — R413 Other amnesia: Secondary | ICD-10-CM

## 2022-03-15 DIAGNOSIS — Z9989 Dependence on other enabling machines and devices: Secondary | ICD-10-CM | POA: Diagnosis not present

## 2022-03-15 DIAGNOSIS — G4733 Obstructive sleep apnea (adult) (pediatric): Secondary | ICD-10-CM

## 2022-03-15 NOTE — Progress Notes (Signed)
[facility] ? ?Provider:  Larey Seat, M D  ?Referring Provider: Ginger Organ., MD ?Primary Care Physician:  Ginger Organ., MD ? ? ? ?SLEEP MEDICINE CLINIC : ? ?Chief Complaint  ?Patient presents with  ? Obstructive Sleep Apnea  ?  Rm 11, alone. Here for initial CPAP f/u and memory. Pt reports doing well on CPAP. taking sertraline for anxiety and doing well. Has been wearing hearing aide since Dec 20, 22. Pt is less concnerned today about memory. Does have family hx of AD. MOCA: 31  ? ? 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 He is of [place], and has a history of sensory neural hearing loss with strong family history for hearing loss and  dementia. He voiced a concern about memory and we will repeat a MOCA test.  ?His new machine's CPAP compliance data are also to be reviewed.  ? ? ? ? ? ?He had a SPLIT study 2017 and was diagnosed with severe apnea, and a HST repeated in 2020 only found an AHI of 8/ h. ?His HTN is well controlled, he is complaint with CPAP and uses a nasal mask Airfit N 2. ResMed. He is due for new device.  ?He is not officially retired and is no longer new cases. He reports some anxiety recently. He is more worried, more anxious , and always concerned worried. He has a touch of OCD. ?He feels that his cognitive function, alertness in mornings is not as good in AM.  ?He is worried about dementia, 2 siblings with dementia. Mother died of dementia,  father had no cognitive concerns.He spends a lot of time on [employer]. Has sometimes vertigo- vestibular rehab and treatment worked for his vertigo, he had some impairment of peripheral vision. Impaired visual spatial sense.  ? ?He is willing to try an inspire , but this mid AHI would not justify a surgical procedure.  ? ? ? ? ? 08-12-2020-referral from Dr. Brigitte Pulse for a re-evaluation  of sleep apnea. He is now due to have a new CPAP machine and cleaned the current one with an ozone generating device. We discussed the recall- he doesn't use a phillips device, but even ResMed want's to discontinue the ozone cleaning devices.  ?He needs to return to hand washing by water, baby soap, and vinegar.  ?Jeremiah Holt also has a need for a new mask he has tried and F3 TRI also known as the DreamWear full facemask.  This is a mask that covers the mouth under the nose his nostrils have been aggravated the skin has been irritated and he would like to alternate these with a triangular full facemask such as the Simplus.  I will be happy to provide that for him he can be fitted through the DME.  His current compliance is excellent 97% by days and 93% of CPAP use for hours per day with an average use at time of 6 hours 55 minutes.  He is using an AutoSet with a minimum pressure of 5 maximum pressure of 10 cmH2O on 3 cmH2O expiratory pressure relief.  Residual AHI is 1.4, speaking for an excellent resolution of apnea he does not have major air leakage and his 95th percentile pressure was at 9 cm water.  There also no central apneas arising. ? ?His Epworth sleepiness score was endorsed at  3 and fatigue severity at 23 points both are well below the beginning levels.  The geriatric depression score was endorsed at 1 point out of 15 no clinical indication for depression is present. ? ?HST from 06-30-2019: Mild overall obstructive sleep apnea at AHI 8.5/h with REM exacerbation to 16.7/h. Intermittent bradycardic heart rate, no prolonged or clinically relevant hypoxia. Sleep apnea was accentuated by supine and prone sleep position.  ? ?He has reduced his HbA1c and HTN- and attributed this to exercises and CPAP use. I will write for a new machine.  ? ?8 month ago he had a vertigo spell in the shower, has done [place] and Dr Brigitte Pulse sent him to [facility]. ?  ?  ? ? ? ? ?HPI:  ?The patient is of [place] descent, raised in the  free evangelical, was referred to a PSG at the [facility] in 2013, was diagnosed with OSA and returned for a CPA titration. ? He was not satisfied with the test facility and the way he received results. He couldn't sleep in the lab and was fitted with a FFM which caused pressure marks. He is here because of recent changes in fatigue and well being , just this March 2015 he became extremely fatigued, irritable and depressed. He sees himself as energetic and was always very energetic at work until this spring. ?He continued to snore , his apnea remained untreated since diagnosis.  ?He has described spells of sleep choking, causing a fear of suffocation. His first time in October 2013 and now again, he had to ask his wife to hold him up, allowing him to breath deep enough to calm down.this also caused anxiety in other aspects of his life, his work productivity is declined, his children noted during vacation. Little things now make him anxious.  ?He would consider himself a habitual mouth breather. He has some allergic rhinitis. His wife reports snoring, when supine not in a recliner.  ? ?He goes to bed between 10.00 PM and shortly after goes to sleep. He likes to listen to an audio book with headphones. He rises at 7 AM, spontaneously.  ?He ha no nocturia but wakes 2-4 times , variable duration. No headaches , no pain. He wakes in general not feeling refreshed or restored. ?If he takes a nap in the afternoon, after lunch  And he wakes much more refreshed, after 30-45 minutes. Naps do not affect his nocturnal sleep. He remembers complex dreams, long, detailed, vivid. ?No dream acting out. No RLS, no pain.  ?He prefers prone sleep. 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 There is no family history of sleep apnea. His father was napping frequently. ? ?Interval History : 10-26-14 ; Attorney ,  used to work for the [employer]. ?He has become a believer in CPAP use, after being highly  sceptical the first month. Now, he stated : "I would pay for it". ?His sleep study was performed in August by SPLIT protocol.  ?His residual AHI is 3.5/hr.  ?Hourly use, 8 hours 37 minutes, 97%.  ? ?10-27-15 ?Interval history -  ?The patient reports that for the first year that he used CPAP at the results were remarkable but for the last 2 months he has felt again fatigued. His compliance record shows that he uses the machine 100% of the time all days over 4 hours. Average user time 7 hours 1 minute, set pressure 6 cm water was 1 cm EPR and  the residual AHI is 2.0. All these are very desirable results. There is no major air leak noted. He endorsed today the Epworth sleepiness score at 4 and the fatigue severity score at 40 2., the fatigue score clearly has risen. An overnight pulse oximetry have been obtained through his primary care physician showing that the patient had a stable heart rate throughout the night and that he did not have significant or prolonged desaturations. The baseline oxygen saturation was 93.7% and the time less then 88% saturation was 1.2 minutes. He would not be qualifying for additional oxygen. ?He was able to reduce the blood pressure medicine dose by health. By end of August  he felt an increasing fatigue again and now takes a half hour nap each day , which refreshes him. ? ?10-26-2016  ?Spends more time in [facility], teaches at [employer], and keeps busy. Jeremiah Holt still travels a lot but mostly now within the [place]. He would like a trouble friendly smaller CPAP machine that allows packing an overnight bag. ?He has been 100% compliant CPAP user has used the machine for 4 consecutive hours on 29 of the last 30 days, average user time 6 hours 22 minutes CPAP still set at 6 cm water was 1 cm EPR and residual apnea is 2.6. ?He uses the machine before midnight. No fatigue.  Nasal pillow.  ? ?01-15-2018, ?I have the pleasure of seeing Jeremiah Holt today who has been a compliant CPAP user for  many years.  Today's download is not different with 97% compliance and average of 6 hours and 49 minutes of daily CPAP use residual AHI of only 2.0/h.  There are some air leaks but there are moderate, 9

## 2022-03-15 NOTE — Patient Instructions (Signed)
There are well-accepted and sensible ways to reduce risk for Alzheimers disease and other degenerative brain disorders . ? ?Exercise Daily Walk A daily 20 minute walk should be part of your routine. Disease related apathy can be a significant roadblock to exercise and the only way to overcome this is to make it a daily routine and perhaps have a reward at the end (something your loved one loves to eat or drink perhaps) or a personal trainer coming to the home can also be very useful. Most importantly, the patient is much more likely to exercise if the caregiver / spouse does it with him/her. In general a structured, repetitive schedule is best. ? ?General Health: Any diseases which effect your body will effect your brain such as a pneumonia, urinary infection, blood clot, heart attack or stroke. Keep contact with your primary care doctor for regular follow ups. ? ?Sleep. A good nights sleep is healthy for the brain. Seven hours is recommended. If you have insomnia or poor sleep habits we can give you some instructions. If you have sleep apnea wear your mask. ? ?Diet: Eating a heart healthy diet is also a good idea; fish and poultry instead of red meat, nuts (mostly non-peanuts), vegetables, fruits, olive oil or canola oil (instead of butter), minimal salt (use other spices to flavor foods), whole grain rice, bread, cereal and pasta and wine in moderation.Research is now showing that the MIND diet, which is a combination of The Mediterranean diet and the DASH diet, is beneficial for cognitive processing and longevity. Information about this diet can be found in The MIND Diet, a book by Doyne Keel, MS, RDN, and online at NotebookDistributors.si ? ?Finances, Power of Producer, television/film/video Directives: You should consider putting legal safeguards in place with regard to financial and medical decision making. While the spouse always has power of attorney for medical and financial issues in the  absence of any form, you should consider what you want in case the spouse / caregiver is no longer around or capable of making decisions.  ? ?The Alzheimers Association Position on Disease Prevention ? ?Can Alzheimer's be prevented? It's a question that continues to intrigue researchers and fuel new investigations. There are no clear-cut answers yet -- partially due to the need for more large-scale studies in diverse populations -- but promising research is under way. The Alzheimer's Association? is leading the worldwide effort to find a treatment for Alzheimer's, delay its onset and prevent it from developing.  ? ?What causes Alzheimer's? ?Experts agree that in the vast majority of cases, Alzheimer's, like other common chronic conditions, probably develops as a result of complex interactions among multiple factors, including age, genetics, environment, lifestyle and coexisting medical conditions. Although some risk factors -- such as age or genes -- cannot be changed, other risk factors -- such as high blood pressure and lack of exercise -- usually can be changed to help reduce risk. Research in these areas may lead to new ways to detect those at highest risk. ? ?Prevention studies ?A small percentage of people with Alzheimer's disease (less than 1 percent) have an early-onset type associated with genetic mutations. Individuals who have these genetic mutations are guaranteed to develop the disease. An ongoing clinical trial conducted by the Dominantly Inherited Alzheimer Network (DIAN), is testing whether antibodies to beta-amyloid can reduce the accumulation of beta-amyloid plaque in the brains of people with such genetic mutations and thereby reduce, delay or prevent symptoms. Participants in the trial are receiving antibodies (  or placebo) before they develop symptoms, and the development of beta-amyloid plaques is being monitored by brain scans and other tests. ? ?Another clinical trial, known as the A4 trial  (Anti-Amyloid Treatment in Asymptomatic Alzheimer's), is testing whether antibodies to beta-amyloid can reduce the risk of Alzheimer's disease in older people (ages 61 to 76) at high risk for the disease. The A4 trial is being conducted by the Alzheimer's Disease Cooperative Study. ? ?Though research is still evolving, evidence is strong that people can reduce their risk by making key lifestyle changes, including participating in regular activity and maintaining good heart health. Based on this research, the Alzheimer's Association offers 10 Ways to Love Your Brain -- a collection of tips that can reduce the risk of cognitive decline. ? ?Heart-head connection ? ?New research shows there are things we can do to reduce the risk of mild cognitive impairment and dementia. ? ?Several conditions known to increase the risk of cardiovascular disease -- such as high blood pressure, diabetes and high cholesterol -- also increase the risk of developing Alzheimer's. Some autopsy studies show that as many as 20 percent of individuals with Alzheimer's disease also have cardiovascular disease. ? ?A longstanding question is why some people develop hallmark Alzheimer's plaques and tangles but do not develop the symptoms of Alzheimer's. Vascular disease may help researchers eventually find an answer. Some autopsy studies suggest that plaques and tangles may be present in the brain without causing symptoms of cognitive decline unless the brain also shows evidence of vascular disease. More research is needed to better understand the link between vascular health and Alzheimer's. ? ?Physical exercise and diet ?Regular physical exercise may be a beneficial strategy to lower the risk of Alzheimer's and vascular dementia. Exercise may directly benefit brain cells by increasing blood and oxygen flow in the brain. Because of its known cardiovascular benefits, a medically approved exercise program is a valuable part of any overall wellness  plan. ? ?Current evidence suggests that heart-healthy eating may also help protect the brain. Heart-healthy eating includes limiting the intake of sugar and saturated fats and making sure to eat plenty of fruits, vegetables, and whole grains. No one diet is best. Two diets that have been studied and may be beneficial are the DASH (Dietary Approaches to Stop Hypertension) diet and the Mediterranean diet. The DASH diet emphasizes vegetables, fruits and fat-free or low-fat dairy products; includes whole grains, fish, poultry, beans, seeds, nuts and vegetable oils; and limits sodium, sweets, sugary beverages and red meats. A Mediterranean diet includes relatively little red meat and emphasizes whole grains, fruits and vegetables, fish and shellfish, and nuts, olive oil and other healthy fats. ? ?Social connections and intellectual activity ?A number of studies indicate that maintaining strong social connections and keeping mentally active as we age might lower the risk of cognitive decline and Alzheimer's. Experts are not certain about the reason for this association. It may be due to direct mechanisms through which social and mental stimulation strengthen connections between nerve cells in the brain. ? ?Head trauma ?There appears to be a strong link between future risk of Alzheimer's and serious head trauma, especially when injury involves loss of consciousness. You can help reduce your risk of Alzheimer's by protecting your head. ? Wear a seat belt ? Use a helmet when participating in sports ? "Fall-proof" your home ?  ?What you can do now ?While research is not yet conclusive, certain lifestyle choices, such as physical activity and diet, may help support brain  health and prevent Alzheimer's. Many of these lifestyle changes have been shown to lower the risk of other diseases, like heart disease and diabetes, which have been linked to Alzheimer's. With few drawbacks and plenty of known benefits, healthy lifestyle  choices can improve your health and possibly protect your brain. ? ?Learn more about brain health. ?You can help increase our knowledge by considering participation in a clinical study. Our free clinical trial matc

## 2022-03-16 DIAGNOSIS — D485 Neoplasm of uncertain behavior of skin: Secondary | ICD-10-CM | POA: Diagnosis not present

## 2022-03-16 DIAGNOSIS — C44329 Squamous cell carcinoma of skin of other parts of face: Secondary | ICD-10-CM | POA: Diagnosis not present

## 2022-03-16 DIAGNOSIS — L570 Actinic keratosis: Secondary | ICD-10-CM | POA: Diagnosis not present

## 2022-03-20 ENCOUNTER — Encounter: Payer: Self-pay | Admitting: Cardiovascular Disease

## 2022-03-20 ENCOUNTER — Ambulatory Visit (INDEPENDENT_AMBULATORY_CARE_PROVIDER_SITE_OTHER): Payer: Medicare Other | Admitting: Cardiovascular Disease

## 2022-03-20 VITALS — BP 134/72 | HR 97 | Ht 71.0 in | Wt 217.8 lb

## 2022-03-20 DIAGNOSIS — I10 Essential (primary) hypertension: Secondary | ICD-10-CM

## 2022-03-20 NOTE — Progress Notes (Signed)
?Cardiology Office Note:   ? ?Date:  03/20/2022  ? ?ID:  TYLIK TREESE, DOB Feb 26, 1951, MRN [id number] ? ?PCP:  Jeremiah Organ., MD ?  ?[facility] Providers ?Cardiologist:  Mertie Moores, MD { ? ?Referring MD: Jeremiah Organ., MD  ? ?Chief Complaint  ?Patient presents with  ? Hypertension  ?   ?  ?  ? ?History of Present Illness:   ? ?Jeremiah Holt is a 71 y.o. male with a hx of anxiety, HTN ? ?Had a panic attack recently, ?Dr. Brigitte Pulse started antianxiety med ?Panic attacks are associated with dyspnea.   ? ? ?Saw him last in 2017  ?Echo showed normal LV function  ?Myoview was normal / low risk  ? ?No CP or dyspnea with walking . ?Has some mild dizziness - sypmtoms sound like volume depletion  ? ?We discussed adding electrolytes to his water  ?Also needs to drink more water  ? ? ? ?+ family hx of CAD  ? ?Walks briskly 6 days a week  ?8000-1000 steps a day  (4-5 miles a day) ?Wears his fit bit  ? ? ?Lives in [place] ) for the most part.  ?Wife is Jeremiah Holt ? ?Is not watching his diet as well as he could .   ?Works for [employer]  ? ? ? ?Past Medical History:  ?Diagnosis Date  ? BPH (benign prostatic hyperplasia)   ? Cancer [facility])   ? Diastolic dysfunction   ? Dilated aortic root ([place])   ? Fatigue   ? Hearing loss   ? History of basal cell cancer   ? History of echocardiogram   ? Echo 11/17: EF 55-60, normal wall motion, Gr 1 diastolic dysfunction, mildly dilated aortic root and ascending aorta (Ao root 40/ascending aorta 38), MAC  ? History of nuclear stress test   ? Myoview 11/17:  Ex 9'; no ischemia or infarct, HTN response to stress with peak BP 192/75; Low Risk  ? History of shingles   ? Hypertension   ? IFG (impaired fasting glucose)   ? Left rib fracture   ? Leg fracture, right   ? OSA on CPAP   ? Prostate cancer ([place])   ? ? ?Past Surgical History:  ?Procedure Laterality Date  ? removal of basal cell skin cancer    ? squamous cell carcinoma removal    ? TONSILLECTOMY AND ADENOIDECTOMY     ? ? ?Current Medications: ?Current Meds  ?Medication Sig  ? atorvastatin (LIPITOR) 40 MG tablet Take 40 mg by mouth daily.   ? cetirizine (ZYRTEC) 10 MG tablet Take 10 mg by mouth daily as needed.   ? famotidine (PEPCID) 20 MG tablet take one tablet by mouth before meals  ? GUAIFENESIN PO Take 1 tablet by mouth at bedtime. 400 mg-62m  ? sertraline (ZOLOFT) 50 MG tablet Take 50 mg by mouth daily.  ? valsartan (DIOVAN) 160 MG tablet Take 160 mg by mouth daily.  ?  ? ?Allergies:   Efudex [fluorouracil] and Ramipril  ? ?Social History  ? ?Socioeconomic History  ? Marital status: Married  ?  Spouse name: Jeremiah Holt  ? Number of children: 2  ? Years of education: [employer] ? Highest education level: Not on file  ?Occupational History  ? Not on file  ?Tobacco Use  ? Smoking status: Former  ?  Types: Cigars  ? Smokeless tobacco: Never  ?Substance and Sexual Activity  ? Alcohol use: Yes  ?  Comment: 1-2 glasses of wine daily  ? Drug use: No  ? Sexual activity: Not on file  ?Other Topics Concern  ? Not on file  ?Social History Narrative  ? Patient is married [place]) and lives at home with his wife.  ? Patient has two adult children.  ? Patient has a Financial risk analyst.  ? Patient is right-handed.  ? Patient drinks three cups of coffee daily.  ? ?Social Determinants of Health  ? ?Financial Resource Strain: Not on file  ?Food Insecurity: Not on file  ?Transportation Needs: Not on file  ?Physical Activity: Not on file  ?Stress: Not on file  ?Social Connections: Not on file  ?  ? ?Family History: ?The patient's family history includes Congestive Heart Failure in his mother; Heart attack (age of onset: 60) in his father; Hypertension in his father and mother; Lymphoma in his father; Prostate cancer in his father. ? ?ROS:   ?Please see the history of present illness.    ? All other systems reviewed and are negative. ? ?EKGs/Labs/Other Studies Reviewed:   ? ?The following studies were reviewed today: ?Labs from Dr. Brigitte Pulse  ? ?EKG:  March 20, 2022: Normal sinus rhythm at 77.  Right bundle branch block. ? ?Recent Labs: ?No results found for requested labs within last 8760 hours.  ?Recent Lipid Panel ?No results found for: CHOL, TRIG, HDL, CHOLHDL, VLDL, LDLCALC, LDLDIRECT ? ? ?Risk Assessment/Calculations:   ?  ? ?    ? ?Physical Exam:   ? ?VS:  BP 134/72 (BP Location: Left Arm, Patient Position: Sitting, Cuff Size: Normal)   Pulse 97   Ht _0  (1.803 m)   Wt 217 lb 12.8 oz (98.8 kg)   SpO2 97%   BMI 30.38 kg/m?    ? ?Wt Readings from Last 3 Encounters:  ?03/20/22 217 lb 12.8 oz (98.8 kg)  ?03/15/22 221 lb (100.2 kg)  ?10/03/21 217 lb 8 oz (98.7 kg)  ?  ? ?GEN:  Well nourished, well developed in no acute distress ?HEENT: Normal ?NECK: No JVD; No carotid bruits ?LYMPHATICS: No lymphadenopathy ?CARDIAC: RRR, no murmurs, rubs, gallops ?RESPIRATORY:  Clear to auscultation without rales, wheezing or rhonchi  ?ABDOMEN: Soft, non-tender, non-distended ?MUSCULOSKELETAL:  No edema; No deformity  ?SKIN: Warm and dry ?NEUROLOGIC:  Alert and oriented x 3 ?PSYCHIATRIC:  Normal affect  ? ?ASSESSMENT:   ? ?1. Essential hypertension   ? ?PLAN:   ? ?In order of problems listed above: ? ?Presyncope: Laurey Arrow presents for further evaluation of presyncope.  He walks every morning 24 and 5 miles.  He typically does not have anything to eat or drink before or while walking.  I suspect that he is volume depleted.  We discussed having him increase his hydration.  We also discussed adding some electrolytes to his water before and during his workouts.  Specifically he mentioned liquid IV, Nuun tablets, V8 juice. ?He had a normal stress test and normal echocardiogram several years ago.  I do not see the need to repeat those studies. ?   ? ?  ? ? ?Medication Adjustments/Labs and Tests Ordered: ?Current medicines are reviewed at length with the patient today.  Concerns regarding medicines are outlined above.  ?Orders Placed This Encounter  ?Procedures  ? EKG 12-Lead   ? ?No orders of the defined types were placed in this encounter. ? ? ?Patient Instructions  ?Medication Instructions:  ?Your physician recommends that you continue on your current medications as directed. Please refer to the  Current Medication list given to you today. ? ?*If you need a refill on your cardiac medications before your next appointment, please call your pharmacy* ? ?Lab Work: ?NONE ?If you have labs (blood work) drawn today and your tests are completely normal, you will receive your results only by: ?MyChart Message (if you have MyChart) OR ?A paper copy in the mail ?If you have any lab test that is abnormal or we need to change your treatment, we will call you to review the results. ? ?Testing/Procedures: ?NONE ? ?Follow-Up: ?At [facility], you and your health needs are our priority.  As part of our continuing mission to provide you with exceptional heart care, we have created designated Provider Care Teams.  These Care Teams include your primary Cardiologist (physician) and Advanced Practice Providers (APPs -  Physician Assistants and Nurse Practitioners) who all work together to provide you with the care you need, when you need it. ? ?Your next appointment:   ?As needed ? ?The format for your next appointment:   ?In Person ? ?Provider:   ?Mertie Moores, MD  ? ?Signed, ?Mertie Moores, MD  ?03/20/2022 6:39 PM    ?[place] ? ?

## 2022-03-20 NOTE — Patient Instructions (Signed)
Medication Instructions:  ?Your physician recommends that you continue on your current medications as directed. Please refer to the Current Medication list given to you today. ? ?*If you need a refill on your cardiac medications before your next appointment, please call your pharmacy* ? ?Lab Work: ?NONE ?If you have labs (blood work) drawn today and your tests are completely normal, you will receive your results only by: ?MyChart Message (if you have MyChart) OR ?A paper copy in the mail ?If you have any lab test that is abnormal or we need to change your treatment, we will call you to review the results. ? ?Testing/Procedures: ?NONE ? ?Follow-Up: ?At [facility], you and your health needs are our priority.  As part of our continuing mission to provide you with exceptional heart care, we have created designated Provider Care Teams.  These Care Teams include your primary Cardiologist (physician) and Advanced Practice Providers (APPs -  Physician Assistants and Nurse Practitioners) who all work together to provide you with the care you need, when you need it. ? ?Your next appointment:   ?As needed ? ?The format for your next appointment:   ?In Person ? ?Provider:   ?Mertie Moores, MD ?

## 2022-04-10 DIAGNOSIS — R21 Rash and other nonspecific skin eruption: Secondary | ICD-10-CM | POA: Diagnosis not present

## 2022-04-10 DIAGNOSIS — B354 Tinea corporis: Secondary | ICD-10-CM | POA: Diagnosis not present

## 2022-04-18 DIAGNOSIS — H9193 Unspecified hearing loss, bilateral: Secondary | ICD-10-CM | POA: Diagnosis not present

## 2022-04-18 DIAGNOSIS — C4492 Squamous cell carcinoma of skin, unspecified: Secondary | ICD-10-CM | POA: Diagnosis not present

## 2022-04-18 DIAGNOSIS — E782 Mixed hyperlipidemia: Secondary | ICD-10-CM | POA: Diagnosis not present

## 2022-04-18 DIAGNOSIS — F411 Generalized anxiety disorder: Secondary | ICD-10-CM | POA: Diagnosis not present

## 2022-04-18 DIAGNOSIS — I10 Essential (primary) hypertension: Secondary | ICD-10-CM | POA: Diagnosis not present

## 2022-04-18 DIAGNOSIS — Z7689 Persons encountering health services in other specified circumstances: Secondary | ICD-10-CM | POA: Diagnosis not present

## 2022-04-18 DIAGNOSIS — C61 Malignant neoplasm of prostate: Secondary | ICD-10-CM | POA: Diagnosis not present

## 2022-04-18 DIAGNOSIS — K219 Gastro-esophageal reflux disease without esophagitis: Secondary | ICD-10-CM | POA: Diagnosis not present

## 2022-04-18 DIAGNOSIS — L282 Other prurigo: Secondary | ICD-10-CM | POA: Diagnosis not present

## 2022-04-18 DIAGNOSIS — G4733 Obstructive sleep apnea (adult) (pediatric): Secondary | ICD-10-CM | POA: Diagnosis not present

## 2022-04-21 DIAGNOSIS — L255 Unspecified contact dermatitis due to plants, except food: Secondary | ICD-10-CM | POA: Diagnosis not present

## 2022-04-21 DIAGNOSIS — L309 Dermatitis, unspecified: Secondary | ICD-10-CM | POA: Diagnosis not present

## 2022-04-24 ENCOUNTER — Telehealth: Payer: Self-pay | Admitting: Cardiovascular Disease

## 2022-04-25 NOTE — Telephone Encounter (Signed)
Patient states that Dr. Raiford Simmonds, APRN at [facility] in [place], [place] is requesting records from his last appt with Dr. Acie Fredrickson.  ? ?PHONE NUMBER [phone] ?FAX NUMBER: [phone] ? ?There is a request for records in the patients referral to Dr. Acie Fredrickson. The referral is marked "Patient Refusal" because it was not for an appointment.  ?

## 2022-05-05 DIAGNOSIS — L501 Idiopathic urticaria: Secondary | ICD-10-CM | POA: Diagnosis not present

## 2022-05-05 DIAGNOSIS — L270 Generalized skin eruption due to drugs and medicaments taken internally: Secondary | ICD-10-CM | POA: Diagnosis not present

## 2022-05-05 DIAGNOSIS — L810 Postinflammatory hyperpigmentation: Secondary | ICD-10-CM | POA: Diagnosis not present

## 2022-05-05 DIAGNOSIS — L308 Other specified dermatitis: Secondary | ICD-10-CM | POA: Diagnosis not present

## 2022-06-26 DIAGNOSIS — D485 Neoplasm of uncertain behavior of skin: Secondary | ICD-10-CM | POA: Diagnosis not present

## 2022-06-26 DIAGNOSIS — D2239 Melanocytic nevi of other parts of face: Secondary | ICD-10-CM | POA: Diagnosis not present

## 2022-06-26 DIAGNOSIS — D225 Melanocytic nevi of trunk: Secondary | ICD-10-CM | POA: Diagnosis not present

## 2022-06-26 DIAGNOSIS — L821 Other seborrheic keratosis: Secondary | ICD-10-CM | POA: Diagnosis not present

## 2022-06-26 DIAGNOSIS — L570 Actinic keratosis: Secondary | ICD-10-CM | POA: Diagnosis not present

## 2022-07-04 DIAGNOSIS — H35373 Puckering of macula, bilateral: Secondary | ICD-10-CM | POA: Diagnosis not present

## 2022-07-04 DIAGNOSIS — H5213 Myopia, bilateral: Secondary | ICD-10-CM | POA: Diagnosis not present

## 2022-07-04 DIAGNOSIS — H2513 Age-related nuclear cataract, bilateral: Secondary | ICD-10-CM | POA: Diagnosis not present

## 2022-07-18 DIAGNOSIS — M722 Plantar fascial fibromatosis: Secondary | ICD-10-CM | POA: Diagnosis not present

## 2022-07-18 DIAGNOSIS — R5383 Other fatigue: Secondary | ICD-10-CM | POA: Diagnosis not present

## 2022-07-18 DIAGNOSIS — E291 Testicular hypofunction: Secondary | ICD-10-CM | POA: Diagnosis not present

## 2022-07-18 DIAGNOSIS — R208 Other disturbances of skin sensation: Secondary | ICD-10-CM | POA: Diagnosis not present

## 2022-08-03 DIAGNOSIS — D044 Carcinoma in situ of skin of scalp and neck: Secondary | ICD-10-CM | POA: Diagnosis not present

## 2022-08-03 DIAGNOSIS — D485 Neoplasm of uncertain behavior of skin: Secondary | ICD-10-CM | POA: Diagnosis not present

## 2022-08-22 ENCOUNTER — Ambulatory Visit (INDEPENDENT_AMBULATORY_CARE_PROVIDER_SITE_OTHER): Payer: Medicare Other | Admitting: Sports Medicine

## 2022-08-22 VITALS — BP 128/60 | Ht 71.0 in | Wt 211.0 lb

## 2022-08-22 DIAGNOSIS — M722 Plantar fascial fibromatosis: Secondary | ICD-10-CM | POA: Diagnosis not present

## 2022-08-22 NOTE — Progress Notes (Unsigned)
   New Patient Office Visit  Subjective   Patient ID: Jeremiah Holt, male    DOB: January 22, 1951  Age: 71 y.o. MRN: [id number]  Left heel pain.  Patient presents today with chief complaint of left heel pain that has been bothering him for the past 2 months, referred by his primary care provider.  His pain began when he was walking and his very sensitive usual type issues was being done in [place].  He likes to walk about 5 miles a day 5 times a week however has been unable to walk outside of daily activities for the past month and a half.  His heel pain has improved with rest rates it about 3/10 with rest however at its worst his pain was a 6/10.  He has tried icing, massage, and heel cups in his shoes.  He denies any calf pain, numbness tingling or radiation of his pain.  ROS as listed above in HPI    Objective:     BP 128/60   Ht '5\' 11"'$  (1.803 m)   Wt 211 lb (95.7 kg)   BMI 29.43 kg/m  Physical Exam Vitals reviewed.  Constitutional:      General: He is not in acute distress.    Appearance: Normal appearance. He is not ill-appearing, toxic-appearing or diaphoretic.  HENT:     Head: Normocephalic.  Pulmonary:     Effort: Pulmonary effort is normal.  Neurological:     Mental Status: He is alert.   Left heel: No obvious deformity or asymmetry.  No ecchymosis or edema.  Tenderness to palpation at the origin of the plantar fascia.  Full range of motion plantarflexion, dorsiflexion, supination, pronation.  Strength 5/5 plantarflexion, dorsiflexion.  Distal pulses 2+ dorsalis pedis.  Negative squeeze test.    Assessment & Plan:   Problem List Items Addressed This Visit       Musculoskeletal and Integument   Plantar fasciitis of left foot - Primary    Patient is counseled on use of appropriate footwear for exercise.  I reviewed with him stretches and strengthening activities.  He is to perform stretches 2-3 times a day for the next 2 weeks.  Recommend stationary bike for cardiovascular  fitness while he is completing his rehabilitation exercises and stretches.  He may slowly return to walking as pain tolerates.  He may continue with heel cups.  Return to clinic as needed.  If his pain does not improve with these exercises we may consider further treatment such as shockwave therapy.       No follow-ups on file.    Elmore Guise, DO  Patient seen and evaluated with the sports medicine fellow.  I agree with the above plan of care.  Treatment as above for Planter fasciitis.  If symptoms persist consider soundwave treatment.  Follow-up for ongoing or recalcitrant issues.

## 2022-08-22 NOTE — Assessment & Plan Note (Signed)
Patient is counseled on use of appropriate footwear for exercise.  I reviewed with him stretches and strengthening activities.  He is to perform stretches 2-3 times a day for the next 2 weeks.  Recommend stationary bike for cardiovascular fitness while he is completing his rehabilitation exercises and stretches.  He may slowly return to walking as pain tolerates.  He may continue with heel cups.  Return to clinic as needed.  If his pain does not improve with these exercises we may consider further treatment such as shockwave therapy.

## 2022-09-07 DIAGNOSIS — Z8546 Personal history of malignant neoplasm of prostate: Secondary | ICD-10-CM | POA: Diagnosis not present

## 2022-09-07 DIAGNOSIS — Z923 Personal history of irradiation: Secondary | ICD-10-CM | POA: Diagnosis not present

## 2022-09-28 DIAGNOSIS — R7301 Impaired fasting glucose: Secondary | ICD-10-CM | POA: Diagnosis not present

## 2022-09-28 DIAGNOSIS — E291 Testicular hypofunction: Secondary | ICD-10-CM | POA: Diagnosis not present

## 2022-10-04 DIAGNOSIS — R82998 Other abnormal findings in urine: Secondary | ICD-10-CM | POA: Diagnosis not present

## 2022-10-04 DIAGNOSIS — Z1331 Encounter for screening for depression: Secondary | ICD-10-CM | POA: Diagnosis not present

## 2022-10-04 DIAGNOSIS — E669 Obesity, unspecified: Secondary | ICD-10-CM | POA: Diagnosis not present

## 2022-10-04 DIAGNOSIS — E785 Hyperlipidemia, unspecified: Secondary | ICD-10-CM | POA: Diagnosis not present

## 2022-10-04 DIAGNOSIS — H9193 Unspecified hearing loss, bilateral: Secondary | ICD-10-CM | POA: Diagnosis not present

## 2022-10-04 DIAGNOSIS — Z23 Encounter for immunization: Secondary | ICD-10-CM | POA: Diagnosis not present

## 2022-10-04 DIAGNOSIS — Z86010 Personal history of colonic polyps: Secondary | ICD-10-CM | POA: Diagnosis not present

## 2022-10-04 DIAGNOSIS — R7301 Impaired fasting glucose: Secondary | ICD-10-CM | POA: Diagnosis not present

## 2022-10-04 DIAGNOSIS — I10 Essential (primary) hypertension: Secondary | ICD-10-CM | POA: Diagnosis not present

## 2022-10-04 DIAGNOSIS — J309 Allergic rhinitis, unspecified: Secondary | ICD-10-CM | POA: Diagnosis not present

## 2022-10-04 DIAGNOSIS — Z0000 Encounter for general adult medical examination without abnormal findings: Secondary | ICD-10-CM | POA: Diagnosis not present

## 2022-10-04 DIAGNOSIS — Z8546 Personal history of malignant neoplasm of prostate: Secondary | ICD-10-CM | POA: Diagnosis not present

## 2022-10-04 DIAGNOSIS — Z1339 Encounter for screening examination for other mental health and behavioral disorders: Secondary | ICD-10-CM | POA: Diagnosis not present

## 2022-11-30 DIAGNOSIS — H903 Sensorineural hearing loss, bilateral: Secondary | ICD-10-CM | POA: Diagnosis not present

## 2022-12-21 DIAGNOSIS — T161XXA Foreign body in right ear, initial encounter: Secondary | ICD-10-CM | POA: Diagnosis not present

## 2022-12-21 DIAGNOSIS — H903 Sensorineural hearing loss, bilateral: Secondary | ICD-10-CM | POA: Diagnosis not present

## 2023-01-12 DIAGNOSIS — E785 Hyperlipidemia, unspecified: Secondary | ICD-10-CM | POA: Diagnosis not present

## 2023-02-22 ENCOUNTER — Ambulatory Visit: Payer: Medicare Other | Admitting: Neurology

## 2023-02-22 ENCOUNTER — Encounter: Payer: Self-pay | Admitting: Neurology

## 2023-03-20 DIAGNOSIS — S61212A Laceration without foreign body of right middle finger without damage to nail, initial encounter: Secondary | ICD-10-CM | POA: Diagnosis not present

## 2023-03-20 DIAGNOSIS — L03011 Cellulitis of right finger: Secondary | ICD-10-CM | POA: Diagnosis not present

## 2023-03-20 DIAGNOSIS — S6991XA Unspecified injury of right wrist, hand and finger(s), initial encounter: Secondary | ICD-10-CM | POA: Diagnosis not present

## 2023-03-29 DIAGNOSIS — Z6829 Body mass index (BMI) 29.0-29.9, adult: Secondary | ICD-10-CM | POA: Diagnosis not present

## 2023-03-29 DIAGNOSIS — E663 Overweight: Secondary | ICD-10-CM | POA: Diagnosis not present

## 2023-03-29 DIAGNOSIS — E782 Mixed hyperlipidemia: Secondary | ICD-10-CM | POA: Diagnosis not present

## 2023-03-29 DIAGNOSIS — L03012 Cellulitis of left finger: Secondary | ICD-10-CM | POA: Diagnosis not present

## 2023-06-05 ENCOUNTER — Ambulatory Visit (INDEPENDENT_AMBULATORY_CARE_PROVIDER_SITE_OTHER): Payer: Medicare Other | Admitting: Student

## 2023-06-05 ENCOUNTER — Encounter (HOSPITAL_BASED_OUTPATIENT_CLINIC_OR_DEPARTMENT_OTHER): Payer: Self-pay | Admitting: Student

## 2023-06-05 DIAGNOSIS — S61412A Laceration without foreign body of left hand, initial encounter: Secondary | ICD-10-CM

## 2023-06-05 NOTE — Progress Notes (Signed)
Chief Complaint: Left hand injury     History of Present Illness:    Jeremiah Holt is a 72 y.o. male presenting today for evaluation after sustaining a left hand a few hours ago.  Patient states that he was working to remove a nail from a piece of wood with a claw hammer when it slipped and the claw side hit his hand between his thumb and pointer finger.  It did cut the skin in this area which opens up when he straightens his thumb.  Current pain levels are a 3 out of 10.  Denies any numbness or tingling or loss of range of motion.  Last tetanus booster was on 03/20/2023.   Surgical History:   None  PMH/PSH/Family History/Social History/Meds/Allergies:    Past Medical History:  Diagnosis Date   BPH (benign prostatic hyperplasia)    Cancer (HCC)    Diastolic dysfunction    Dilated aortic root (HCC)    Fatigue    Hearing loss    History of basal cell cancer    History of echocardiogram    Echo 11/17: EF 55-60, normal wall motion, Gr 1 diastolic dysfunction, mildly dilated aortic root and ascending aorta (Ao root 40/ascending aorta 38), MAC   History of nuclear stress test    Myoview 11/17:  Ex 9'; no ischemia or infarct, HTN response to stress with peak BP 192/75; Low Risk   History of shingles    Hypertension    IFG (impaired fasting glucose)    Left rib fracture    Leg fracture, right    OSA on CPAP    Prostate cancer (HCC)    Past Surgical History:  Procedure Laterality Date   removal of basal cell skin cancer     squamous cell carcinoma removal     TONSILLECTOMY AND ADENOIDECTOMY     Social History   Socioeconomic History   Marital status: Married    Spouse name: [name]   Number of children: 2   Years of education: Optician, dispensing   Highest education level: Not on file  Occupational History   Not on file  Tobacco Use   Smoking status: Former    Types: Cigars   Smokeless tobacco: Never  Substance and Sexual Activity   Alcohol  use: Yes    Comment: 1-2 glasses of wine daily   Drug use: No   Sexual activity: Not on file  Other Topics Concern   Not on file  Social History Narrative   Patient is married ([name]) and lives at home with his wife.   Patient has two adult children.   Patient has a Naval architect.   Patient is right-handed.   Patient drinks three cups of coffee daily.   Social Determinants of Health   Financial Resource Strain: Not on file  Food Insecurity: Not on file  Transportation Needs: Not on file  Physical Activity: Not on file  Stress: Not on file  Social Connections: Not on file   Family History  Problem Relation Age of Onset   Lymphoma Father    Hypertension Father    Prostate cancer Father    Heart attack Father 29   Congestive Heart Failure Mother    Hypertension Mother    Allergies  Allergen Reactions   Efudex [Fluorouracil] Dermatitis  Rash, oozing   Ramipril     Other reaction(s): urinary symptoms   Current Outpatient Medications  Medication Sig Dispense Refill   atorvastatin (LIPITOR) 40 MG tablet Take 40 mg by mouth daily.      cetirizine (ZYRTEC) 10 MG tablet Take 10 mg by mouth daily as needed.      famotidine (PEPCID) 20 MG tablet take one tablet by mouth before meals     GUAIFENESIN PO Take 1 tablet by mouth at bedtime. 400 mg-600mg      sertraline (ZOLOFT) 50 MG tablet Take 50 mg by mouth daily.     valsartan (DIOVAN) 160 MG tablet Take 160 mg by mouth daily.     No current facility-administered medications for this visit.   No results found.  Review of Systems:   A ROS was performed including pertinent positives and negatives as documented in the HPI.  Physical Exam :   Constitutional: NAD and appears stated age Neurological: Alert and oriented Psych: Appropriate affect and cooperative There were no vitals taken for this visit.   Comprehensive Musculoskeletal Exam:    Small laceration noted in the first webspace that is approximately 1.5 cm.   No foreign bodies in the wound with inspection.  First and second phalanges and metacarpals are nontender to palpation.  Full range of motion of the thumb with flexion extension and opposition.  Radial pulse 2+.  Imaging:     Assessment:   72 y.o. male with a laceration in the first webspace of the left hand.  This area is neurovascularly intact with no range of motion deficits.  I do believe that wound closure is all that is indicated at this time.  Wound was thoroughly rinsed with sterile water and cleaned using Betadine.  3 mL of lidocaine was used to numb the area and 2 simple interrupted sutures were placed using 3-0 nylon.  Wound edges were well-approximated and was then covered with a bandage.  Patient tolerated this well and I will plan to see him back within 7 to 10 days for suture removal.  Procedure Note  Patient: Jeremiah Holt             Date of Birth: Oct 31, 1951           MRN: [id number]             Visit Date: 06/05/2023  Procedures: Visit Diagnoses:  1. Laceration of left hand without foreign body, initial encounter     Laceration Repair  Date/Time: 06/05/2023 11:25 PM  Performed by: Amador Cunas, PA-C Authorized by: Amador Cunas, PA-C   Consent:    Consent obtained:  Verbal   Consent given by:  Patient   Risks, benefits, and alternatives were discussed: yes   Anesthesia:    Anesthesia method:  Local infiltration   Local anesthetic:  Lidocaine 1% w/o epi Laceration details:    Location:  Hand Pre-procedure details:    Preparation:  Patient was prepped and draped in usual sterile fashion Exploration:    Wound extent: no nerve damage noted, no tendon damage noted and no vascular damage noted   Treatment:    Area cleansed with:  Povidone-iodine   Irrigation solution:  Sterile saline Skin repair:    Repair method:  Sutures   Suture size:  3-0   Suture material:  Nylon   Suture technique:  Simple interrupted   Number of sutures:  2 Approximation:     Approximation:  Close Repair type:  Repair type:  Simple Post-procedure details:    Dressing:  Adhesive bandage   Procedure completion:  Tolerated well, no immediate complications      Plan :    -Return to clinic in 7 to 10 days for suture removal and reassessment     I personally saw and evaluated the patient, and participated in the management and treatment plan.  Hazle Nordmann, PA-C Orthopedics  This document was dictated using Conservation officer, historic buildings. A reasonable attempt at proof reading has been made to minimize errors.

## 2023-06-10 ENCOUNTER — Ambulatory Visit (HOSPITAL_COMMUNITY)
Admission: EM | Admit: 2023-06-10 | Discharge: 2023-06-10 | Disposition: A | Discharge status: Home / Self Care | Payer: Medicare Other | Attending: Internal Medicine | Admitting: Internal Medicine

## 2023-06-10 ENCOUNTER — Ambulatory Visit (HOSPITAL_COMMUNITY): Payer: Self-pay

## 2023-06-10 ENCOUNTER — Other Ambulatory Visit: Payer: Self-pay

## 2023-06-10 ENCOUNTER — Encounter (HOSPITAL_COMMUNITY): Payer: Self-pay | Admitting: *Deleted

## 2023-06-10 DIAGNOSIS — L03114 Cellulitis of left upper limb: Secondary | ICD-10-CM | POA: Diagnosis not present

## 2023-06-10 DIAGNOSIS — S61412D Laceration without foreign body of left hand, subsequent encounter: Secondary | ICD-10-CM

## 2023-06-10 MED ORDER — BACITRACIN ZINC 500 UNIT/GM EX OINT
TOPICAL_OINTMENT | Freq: Once | CUTANEOUS | Status: AC
  Administered 2023-06-10: 1 via TOPICAL

## 2023-06-10 MED ORDER — AMOXICILLIN-POT CLAVULANATE 875-125 MG PO TABS: 1.0000 | ORAL_TABLET | Freq: Two times a day (BID) | ORAL | 0 refills | Status: DC

## 2023-06-10 MED ORDER — MUPIROCIN 2 % EX OINT: 1.0000 | TOPICAL_OINTMENT | Freq: Two times a day (BID) | CUTANEOUS | 0 refills | Status: DC

## 2023-06-10 NOTE — Discharge Instructions (Signed)
Your hand laceration appears infected today.  Take Augmentin antibiotic twice daily for the next 7 days with food.  Apply mupirocin ointment to the wound twice a day for the next 7 days as well.  You may dress the wound with nonstick gauze and Coban wrap as shown in clinic. Cleanse the wound twice daily with gentle soap and warm water.  Do not scrub the wound. Allow the wound to be open to air at nighttime.  Suture removal as scheduled on Thursday.  If the redness, swelling, pain, etc. is not improving in the next 24 to 48 hours, please return to urgent care.  Please go to the nearest emergency department as needed for severe symptoms. I hope you feel better!

## 2023-06-10 NOTE — ED Provider Notes (Signed)
[facility]    CSN: [id number] Arrival date & time: 06/10/23  1146      History   Chief Complaint Chief Complaint  Patient presents with   Hand Injury    HPI Jeremiah Holt is a 72 y.o. male.   Patient presents to urgent care for evaluation of left hand swelling, tenderness, purulent/yellow drainage, and redness that started yesterday to laceration that he sustained 5 days ago.  Laceration was repaired with sutures at [facility] 5 days ago on June 05, 2023 after patient suffered a puncture wound to the left hand in between the first and second digits.  States the hand was not swollen, warm, or tender after laceration until yesterday.  Sutures remain intact.  He has been cleansing the wound as directed.  No recent antibiotic/steroid use.  He has not been using any over-the-counter creams or lotions to the area.     Past Medical History:  Diagnosis Date   BPH (benign prostatic hyperplasia)    Cancer (HCC)    Diastolic dysfunction    Dilated aortic root (HCC)    Fatigue    Hearing loss    History of basal cell cancer    History of echocardiogram    Echo 11/17: EF 55-60, normal wall motion, Gr 1 diastolic dysfunction, mildly dilated aortic root and ascending aorta (Ao root 40/ascending aorta 38), MAC   History of nuclear stress test    Myoview 11/17:  Ex 9'; no ischemia or infarct, HTN response to stress with peak BP 192/75; Low Risk   History of shingles    Hypertension    IFG (impaired fasting glucose)    Left rib fracture    Leg fracture, right    OSA on CPAP    Prostate cancer [facility])     Patient Active Problem List   Diagnosis Date Noted   Plantar fasciitis of left foot 08/22/2022   Labyrinthine vestibulitis, left 10/03/2021   Memory difficulties 10/03/2021   Essential hypertension 10/12/2014   Hyperlipidemia 10/12/2014   OSA on CPAP 06/17/2014    Past Surgical History:  Procedure Laterality Date   removal of basal cell skin cancer      squamous cell carcinoma removal     TONSILLECTOMY AND ADENOIDECTOMY         Home Medications    Prior to Admission medications   Medication Sig Start Date End Date Taking? Authorizing Provider  amoxicillin-clavulanate (AUGMENTIN) 875-125 MG tablet Take 1 tablet by mouth every 12 (twelve) hours. 06/10/23  Yes Carlisle Beers, FNP  atorvastatin (LIPITOR) 40 MG tablet Take 40 mg by mouth daily.  12/16/13  Yes [provider]  mupirocin ointment (BACTROBAN) 2 % Apply 1 Application topically 2 (two) times daily. 06/10/23  Yes Carlisle Beers, FNP  valsartan (DIOVAN) 160 MG tablet Take 160 mg by mouth daily.   Yes [provider]  cetirizine (ZYRTEC) 10 MG tablet Take 10 mg by mouth daily as needed.     [provider]  famotidine (PEPCID) 20 MG tablet take one tablet by mouth before meals 07/10/19   [provider]  GUAIFENESIN PO Take 1 tablet by mouth at bedtime. 400 mg-600mg     [provider]  sertraline (ZOLOFT) 50 MG tablet Take 50 mg by mouth daily. 02/22/22   [provider]    Family History Family History  Problem Relation Age of Onset   Lymphoma Father    Hypertension Father    Prostate  cancer Father    Heart attack Father 27   Congestive Heart Failure Mother    Hypertension Mother     Social History Social History   Tobacco Use   Smoking status: Former    Types: Cigars   Smokeless tobacco: Never  Substance Use Topics   Alcohol use: Yes    Comment: 1-2 glasses of wine daily   Drug use: No     Allergies   Efudex [fluorouracil] and Ramipril   Review of Systems Review of Systems Per HPI  Physical Exam Triage Vital Signs ED Triage Vitals  Enc Vitals Group     BP 06/10/23 1208 (!) 146/78     Pulse Rate 06/10/23 1208 78     Resp 06/10/23 1208 18     Temp 06/10/23 1208 97.8 F (36.6 C)     Temp src --      SpO2 06/10/23 1208 96 %     Weight --      Height --      Head Circumference --       Peak Flow --      Pain Score 06/10/23 1206 5     Pain Loc --      Pain Edu? --      Excl. in GC? --    No data found.  Updated Vital Signs BP (!) 146/78   Pulse 78   Temp 97.8 F (36.6 C)   Resp 18   SpO2 96%   Visual Acuity Right Eye Distance:   Left Eye Distance:   Bilateral Distance:    Right Eye Near:   Left Eye Near:    Bilateral Near:     Physical Exam Vitals and nursing note reviewed.  Constitutional:      Appearance: He is not ill-appearing or toxic-appearing.  HENT:     Head: Normocephalic and atraumatic.     Right Ear: Hearing and external ear normal.     Left Ear: Hearing and external ear normal.     Nose: Nose normal.     Mouth/Throat:     Lips: Pink.  Eyes:     General: Lids are normal. Vision grossly intact. Gaze aligned appropriately.     Extraocular Movements: Extraocular movements intact.     Conjunctiva/sclera: Conjunctivae normal.  Pulmonary:     Effort: Pulmonary effort is normal.  Musculoskeletal:     Cervical back: Neck supple.  Skin:    General: Skin is warm and dry.     Capillary Refill: Capillary refill takes less than 2 seconds.     Findings: Erythema and laceration present. No rash.     Comments: Erythema, soft tissue swelling, and 1cm area of induration surrounding skin of the left hand as seen in image below. Some warmth present. Less than 2 cap refill distally, +2 left radial pulse. Sensation and strength intact distally.   Neurological:     General: No focal deficit present.     Mental Status: He is alert and oriented to person, place, and time. Mental status is at baseline.     Cranial Nerves: No dysarthria or facial asymmetry.  Psychiatric:        Mood and Affect: Mood normal.        Speech: Speech normal.        Behavior: Behavior normal.        Thought Content: Thought content normal.        Judgment: Judgment normal.   Laceration to left hand  UC Treatments / Results  Labs (all labs ordered are listed, but only  abnormal results are displayed) Labs Reviewed - No data to display  EKG   Radiology No results found.  Procedures Procedures (including critical care time)  Medications Ordered in UC Medications  bacitracin ointment (has no administration in time range)    Initial Impression / Assessment and Plan / UC Course  I have reviewed the triage vital signs and the nursing notes.  Pertinent labs & imaging results that were available during my care of the patient were reviewed by me and considered in my medical decision making (see chart for details).   1. Laceration of left hand without foreign body, subsequent encounter, cellulitis of left hand Soft tissue infection surrounding laceration will be treated with Augmentin twice a day for 7 days.  Mupirocin ointment topically twice daily for 7 days as well.  Advised to cleanse and dress wound in the morning, then leave wound open to air at nighttime. Will leave sutures intact at this time to allow wound to heal more since it has only been 5 days. Keep appointment in 5 days for suture removal.  Discussed red flag signs and symptoms of worsening condition,when to call the PCP office, return to urgent care, and when to seek higher level of care in the emergency department. Counseled patient regarding appropriate use of medications and potential side effects for all medications recommended or prescribed today. Patient verbalizes understanding and agreement with plan. Discharged in stable condition.     Final Clinical Impressions(s) / UC Diagnoses   Final diagnoses:  Cellulitis of left hand  Laceration of left hand without foreign body, subsequent encounter     Discharge Instructions      Your hand laceration appears infected today.  Take Augmentin antibiotic twice daily for the next 7 days with food.  Apply mupirocin ointment to the wound twice a day for the next 7 days as well.  You may dress the wound with nonstick gauze and Coban wrap  as shown in clinic. Cleanse the wound twice daily with gentle soap and warm water.  Do not scrub the wound. Allow the wound to be open to air at nighttime.  Suture removal as scheduled on Thursday.  If the redness, swelling, pain, etc. is not improving in the next 24 to 48 hours, please return to urgent care.  Please go to the nearest emergency department as needed for severe symptoms. I hope you feel better!      ED Prescriptions     Medication Sig Dispense Auth. Provider   amoxicillin-clavulanate (AUGMENTIN) 875-125 MG tablet Take 1 tablet by mouth every 12 (twelve) hours. 14 tablet Carlisle Beers, FNP   mupirocin ointment (BACTROBAN) 2 % Apply 1 Application topically 2 (two) times daily. 22 g Carlisle Beers, FNP      PDMP not reviewed this encounter.   Carlisle Beers, [place] 06/10/23 1301

## 2023-06-10 NOTE — ED Triage Notes (Signed)
Pt reports swelling to Lt hand at wound site started last night . Wound was fist seen and treated  on WED. at [facility] .

## 2023-06-11 ENCOUNTER — Ambulatory Visit (INDEPENDENT_AMBULATORY_CARE_PROVIDER_SITE_OTHER): Payer: Medicare Other | Admitting: Student

## 2023-06-11 ENCOUNTER — Encounter (HOSPITAL_BASED_OUTPATIENT_CLINIC_OR_DEPARTMENT_OTHER): Payer: Self-pay

## 2023-06-11 DIAGNOSIS — S61412D Laceration without foreign body of left hand, subsequent encounter: Secondary | ICD-10-CM | POA: Diagnosis not present

## 2023-06-11 NOTE — Telephone Encounter (Signed)
Advised to have pt come to clinic for eval. Scheduled for 3:30pm

## 2023-06-11 NOTE — Progress Notes (Signed)
Chief Complaint: Left hand injury     History of Present Illness:   06/11/23: Patient presents today for wound check.  He was seen at urgent care yesterday and diagnosed with an infection of the wound and started on Augmentin 875-125 as well as mupirocin ointment.  Patient states that he has had increased drainage from the wound and that he is unsure if the sutures are still in place.  Denies any fever or chills.    Jeremiah Holt is a 72 y.o. male presenting today for evaluation after sustaining a left hand a few hours ago.  Patient states that he was working to remove a nail from a piece of wood with a claw hammer when it slipped and the claw side hit his hand between his thumb and pointer finger.  It did cut the skin in this area which opens up when he straightens his thumb.  Current pain levels are a 3 out of 10.  Denies any numbness or tingling or loss of range of motion.  Last tetanus booster was on 03/20/2023.   Surgical History:   None  PMH/PSH/Family History/Social History/Meds/Allergies:    Past Medical History:  Diagnosis Date   BPH (benign prostatic hyperplasia)    Cancer (HCC)    Diastolic dysfunction    Dilated aortic root (HCC)    Fatigue    Hearing loss    History of basal cell cancer    History of echocardiogram    Echo 11/17: EF 55-60, normal wall motion, Gr 1 diastolic dysfunction, mildly dilated aortic root and ascending aorta (Ao root 40/ascending aorta 38), MAC   History of nuclear stress test    Myoview 11/17:  Ex 9'; no ischemia or infarct, HTN response to stress with peak BP 192/75; Low Risk   History of shingles    Hypertension    IFG (impaired fasting glucose)    Left rib fracture    Leg fracture, right    OSA on CPAP    Prostate cancer (HCC)    Past Surgical History:  Procedure Laterality Date   removal of basal cell skin cancer     squamous cell carcinoma removal     TONSILLECTOMY AND ADENOIDECTOMY      Social History   Socioeconomic History   Marital status: Married    Spouse name: [name]   Number of children: 2   Years of education: Optician, dispensing   Highest education level: Not on file  Occupational History   Not on file  Tobacco Use   Smoking status: Former    Types: Cigars   Smokeless tobacco: Never  Substance and Sexual Activity   Alcohol use: Yes    Comment: 1-2 glasses of wine daily   Drug use: No   Sexual activity: Not on file  Other Topics Concern   Not on file  Social History Narrative   Patient is married ([name]) and lives at home with his wife.   Patient has two adult children.   Patient has a Naval architect.   Patient is right-handed.   Patient drinks three cups of coffee daily.   Social Determinants of Health   Financial Resource Strain: Not on file  Food Insecurity: Not on file  Transportation Needs: Not on file  Physical Activity: Not on file  Stress: Not on  file  Social Connections: Not on file   Family History  Problem Relation Age of Onset   Lymphoma Father    Hypertension Father    Prostate cancer Father    Heart attack Father 32   Congestive Heart Failure Mother    Hypertension Mother    Allergies  Allergen Reactions   Efudex [Fluorouracil] Dermatitis    Rash, oozing   Ramipril     Other reaction(s): urinary symptoms   Current Outpatient Medications  Medication Sig Dispense Refill   amoxicillin-clavulanate (AUGMENTIN) 875-125 MG tablet Take 1 tablet by mouth every 12 (twelve) hours. 14 tablet 0   atorvastatin (LIPITOR) 40 MG tablet Take 40 mg by mouth daily.      cetirizine (ZYRTEC) 10 MG tablet Take 10 mg by mouth daily as needed.      famotidine (PEPCID) 20 MG tablet take one tablet by mouth before meals     GUAIFENESIN PO Take 1 tablet by mouth at bedtime. 400 mg-600mg      mupirocin ointment (BACTROBAN) 2 % Apply 1 Application topically 2 (two) times daily. 22 g 0   sertraline (ZOLOFT) 50 MG tablet Take 50 mg by mouth daily.      valsartan (DIOVAN) 160 MG tablet Take 160 mg by mouth daily.     No current facility-administered medications for this visit.   No results found.  Review of Systems:   A ROS was performed including pertinent positives and negatives as documented in the HPI.  Physical Exam :   Constitutional: NAD and appears stated age Neurological: Alert and oriented Psych: Appropriate affect and cooperative There were no vitals taken for this visit.   Comprehensive Musculoskeletal Exam:    Small laceration of the left hand with mild surrounding erythema and small area of induration.  Blood-tinged purulent drainage from the wound.  Radial pulse 2+.  Neurosensory exam intact.   Imaging:     Assessment:    72 y.o. male with laceration of the first webspace in the left hand after a puncture wound.  He has developed an infection and was placed on Augmentin and mupirocin as of yesterday.  He is getting some drainage from the wound but overall it is well-appearing.  I would like to allow a few days for infection to improve before removing sutures and addressing closure of the wound.  Discussed return precautions including worsening redness around the wound as well as fever or chills.  Will plan to see him back in 4 more days for suture removal and reevaluation.   Plan:    -Return to clinic on 06/15/2023 for suture removal and wound check     I personally saw and evaluated the patient, and participated in the management and treatment plan.  Hazle Nordmann, PA-C Orthopedics  This document was dictated using Conservation officer, historic buildings. A reasonable attempt at proof reading has been made to minimize errors.

## 2023-06-14 ENCOUNTER — Ambulatory Visit (INDEPENDENT_AMBULATORY_CARE_PROVIDER_SITE_OTHER): Payer: Medicare Other | Admitting: Student

## 2023-06-14 ENCOUNTER — Encounter (HOSPITAL_BASED_OUTPATIENT_CLINIC_OR_DEPARTMENT_OTHER): Payer: Self-pay

## 2023-06-14 ENCOUNTER — Encounter (HOSPITAL_BASED_OUTPATIENT_CLINIC_OR_DEPARTMENT_OTHER): Payer: Self-pay | Admitting: Student

## 2023-06-14 ENCOUNTER — Ambulatory Visit (HOSPITAL_BASED_OUTPATIENT_CLINIC_OR_DEPARTMENT_OTHER): Payer: BLUE CROSS/BLUE SHIELD | Admitting: Student

## 2023-06-14 DIAGNOSIS — S61412D Laceration without foreign body of left hand, subsequent encounter: Secondary | ICD-10-CM

## 2023-06-14 NOTE — Progress Notes (Signed)
Chief Complaint: Left hand injury     History of Present Illness:   06/14/23: Patient presents today for wound check of his left hand, 9 days since the injury.  States that over the past few days he has had improvement in redness and swelling.  Denies any fever, chills, or drainage from the wound.  Has still been taking his prescription of Augmentin 875-125mg  and has about 2 days left.    06/05/23: Jeremiah Holt is a 72 y.o. male presenting today for evaluation after sustaining a left hand a few hours ago.  Patient states that he was working to remove a nail from a piece of wood with a claw hammer when it slipped and the claw side hit his hand between his thumb and pointer finger.  It did cut the skin in this area which opens up when he straightens his thumb.  Current pain levels are a 3 out of 10.  Denies any numbness or tingling or loss of range of motion.  Last tetanus booster was on 03/20/2023.   Surgical History:   None  PMH/PSH/Family History/Social History/Meds/Allergies:    Past Medical History:  Diagnosis Date   BPH (benign prostatic hyperplasia)    Cancer (HCC)    Diastolic dysfunction    Dilated aortic root (HCC)    Fatigue    Hearing loss    History of basal cell cancer    History of echocardiogram    Echo 11/17: EF 55-60, normal wall motion, Gr 1 diastolic dysfunction, mildly dilated aortic root and ascending aorta (Ao root 40/ascending aorta 38), MAC   History of nuclear stress test    Myoview 11/17:  Ex 9'; no ischemia or infarct, HTN response to stress with peak BP 192/75; Low Risk   History of shingles    Hypertension    IFG (impaired fasting glucose)    Left rib fracture    Leg fracture, right    OSA on CPAP    Prostate cancer (HCC)    Past Surgical History:  Procedure Laterality Date   removal of basal cell skin cancer     squamous cell carcinoma removal     TONSILLECTOMY AND ADENOIDECTOMY     Social History    Socioeconomic History   Marital status: Married    Spouse name: [name]   Number of children: 2   Years of education: Optician, dispensing   Highest education level: Not on file  Occupational History   Not on file  Tobacco Use   Smoking status: Former    Types: Cigars   Smokeless tobacco: Never  Substance and Sexual Activity   Alcohol use: Yes    Comment: 1-2 glasses of wine daily   Drug use: No   Sexual activity: Not on file  Other Topics Concern   Not on file  Social History Narrative   Patient is married ([name]) and lives at home with his wife.   Patient has two adult children.   Patient has a Naval architect.   Patient is right-handed.   Patient drinks three cups of coffee daily.   Social Determinants of Health   Financial Resource Strain: Not on file  Food Insecurity: Not on file  Transportation Needs: Not on file  Physical Activity: Not on file  Stress: Not on file  Social Connections:  Not on file   Family History  Problem Relation Age of Onset   Lymphoma Father    Hypertension Father    Prostate cancer Father    Heart attack Father 46   Congestive Heart Failure Mother    Hypertension Mother    Allergies  Allergen Reactions   Efudex [Fluorouracil] Dermatitis    Rash, oozing   Ramipril     Other reaction(s): urinary symptoms   Current Outpatient Medications  Medication Sig Dispense Refill   amoxicillin-clavulanate (AUGMENTIN) 875-125 MG tablet Take 1 tablet by mouth every 12 (twelve) hours. 14 tablet 0   atorvastatin (LIPITOR) 40 MG tablet Take 40 mg by mouth daily.      cetirizine (ZYRTEC) 10 MG tablet Take 10 mg by mouth daily as needed.      famotidine (PEPCID) 20 MG tablet take one tablet by mouth before meals     GUAIFENESIN PO Take 1 tablet by mouth at bedtime. 400 mg-600mg      mupirocin ointment (BACTROBAN) 2 % Apply 1 Application topically 2 (two) times daily. 22 g 0   sertraline (ZOLOFT) 50 MG tablet Take 50 mg by mouth daily.     valsartan (DIOVAN)  160 MG tablet Take 160 mg by mouth daily.     No current facility-administered medications for this visit.   No results found.  Review of Systems:   A ROS was performed including pertinent positives and negatives as documented in the HPI.  Physical Exam :   Constitutional: NAD and appears stated age Neurological: Alert and oriented Psych: Appropriate affect and cooperative There were no vitals taken for this visit.   Comprehensive Musculoskeletal Exam:    Laceration of the left hand is well-appearing without evidence of erythema or drainage.  Area of induration directly surrounding the wound.  Inspection of wound after suture removal reveals complete approximation with only breakage in the epidermis remaining.  Patient has full range of motion of the thumb and can make a closed fist.  Neurosensory exam remains intact.   Imaging:     Assessment:    72 y.o. male 9 days status post left hand laceration.  Wound appearance shows significant improvement from a few days ago with continued Augmentin therapy.  Sutures were removed today which revealed good closure of the wound therefore not requiring any further intervention.  Plan today is to finish antibiotic therapy as scheduled and can keep wound covered and bandaged as necessary.  Advised that swelling and induration around the wound should continue to improve with healing.  Should this not continue to prep improve over the next few weeks or any symptoms develop concerning for infection I would be happy to see him back for reevaluation.  Otherwise, return to clinic as needed.  Plan:    -Return to clinic as needed     I personally saw and evaluated the patient, and participated in the management and treatment plan.  Hazle Nordmann, PA-C Orthopedics  This document was dictated using Conservation officer, historic buildings. A reasonable attempt at proof reading has been made to minimize errors.

## 2023-06-15 ENCOUNTER — Ambulatory Visit (HOSPITAL_BASED_OUTPATIENT_CLINIC_OR_DEPARTMENT_OTHER): Payer: BLUE CROSS/BLUE SHIELD | Admitting: Student

## 2023-06-27 DIAGNOSIS — C4442 Squamous cell carcinoma of skin of scalp and neck: Secondary | ICD-10-CM | POA: Diagnosis not present

## 2023-06-27 DIAGNOSIS — D485 Neoplasm of uncertain behavior of skin: Secondary | ICD-10-CM | POA: Diagnosis not present

## 2023-06-27 DIAGNOSIS — L570 Actinic keratosis: Secondary | ICD-10-CM | POA: Diagnosis not present

## 2023-06-27 DIAGNOSIS — L821 Other seborrheic keratosis: Secondary | ICD-10-CM | POA: Diagnosis not present

## 2023-06-27 DIAGNOSIS — D045 Carcinoma in situ of skin of trunk: Secondary | ICD-10-CM | POA: Diagnosis not present

## 2023-06-27 DIAGNOSIS — D225 Melanocytic nevi of trunk: Secondary | ICD-10-CM | POA: Diagnosis not present

## 2023-08-10 DIAGNOSIS — H2513 Age-related nuclear cataract, bilateral: Secondary | ICD-10-CM | POA: Diagnosis not present

## 2023-08-10 DIAGNOSIS — H5213 Myopia, bilateral: Secondary | ICD-10-CM | POA: Diagnosis not present

## 2023-08-10 DIAGNOSIS — H35373 Puckering of macula, bilateral: Secondary | ICD-10-CM | POA: Diagnosis not present

## 2023-10-18 DIAGNOSIS — Z923 Personal history of irradiation: Secondary | ICD-10-CM | POA: Diagnosis not present

## 2023-10-18 DIAGNOSIS — Z8546 Personal history of malignant neoplasm of prostate: Secondary | ICD-10-CM | POA: Diagnosis not present

## 2023-11-08 ENCOUNTER — Ambulatory Visit: Payer: Medicare Other | Admitting: Neurology

## 2023-11-19 DIAGNOSIS — R7301 Impaired fasting glucose: Secondary | ICD-10-CM | POA: Diagnosis not present

## 2023-11-19 DIAGNOSIS — E291 Testicular hypofunction: Secondary | ICD-10-CM | POA: Diagnosis not present

## 2023-11-19 DIAGNOSIS — I10 Essential (primary) hypertension: Secondary | ICD-10-CM | POA: Diagnosis not present

## 2023-11-19 DIAGNOSIS — R5383 Other fatigue: Secondary | ICD-10-CM | POA: Diagnosis not present

## 2023-11-19 DIAGNOSIS — E785 Hyperlipidemia, unspecified: Secondary | ICD-10-CM | POA: Diagnosis not present

## 2023-11-22 DIAGNOSIS — H8112 Benign paroxysmal vertigo, left ear: Secondary | ICD-10-CM | POA: Diagnosis not present

## 2023-11-22 DIAGNOSIS — E669 Obesity, unspecified: Secondary | ICD-10-CM | POA: Diagnosis not present

## 2023-11-22 DIAGNOSIS — G4733 Obstructive sleep apnea (adult) (pediatric): Secondary | ICD-10-CM | POA: Diagnosis not present

## 2023-11-22 DIAGNOSIS — E785 Hyperlipidemia, unspecified: Secondary | ICD-10-CM | POA: Diagnosis not present

## 2023-11-22 DIAGNOSIS — I10 Essential (primary) hypertension: Secondary | ICD-10-CM | POA: Diagnosis not present

## 2023-11-22 DIAGNOSIS — Z1331 Encounter for screening for depression: Secondary | ICD-10-CM | POA: Diagnosis not present

## 2023-11-22 DIAGNOSIS — Z23 Encounter for immunization: Secondary | ICD-10-CM | POA: Diagnosis not present

## 2023-11-22 DIAGNOSIS — Z0000 Encounter for general adult medical examination without abnormal findings: Secondary | ICD-10-CM | POA: Diagnosis not present

## 2023-11-22 DIAGNOSIS — R5383 Other fatigue: Secondary | ICD-10-CM | POA: Diagnosis not present

## 2023-11-22 DIAGNOSIS — R7301 Impaired fasting glucose: Secondary | ICD-10-CM | POA: Diagnosis not present

## 2023-11-22 DIAGNOSIS — Z1339 Encounter for screening examination for other mental health and behavioral disorders: Secondary | ICD-10-CM | POA: Diagnosis not present

## 2023-11-22 DIAGNOSIS — Z8546 Personal history of malignant neoplasm of prostate: Secondary | ICD-10-CM | POA: Diagnosis not present

## 2023-11-22 DIAGNOSIS — R82998 Other abnormal findings in urine: Secondary | ICD-10-CM | POA: Diagnosis not present

## 2023-12-25 ENCOUNTER — Ambulatory Visit (INDEPENDENT_AMBULATORY_CARE_PROVIDER_SITE_OTHER): Payer: Medicare Other | Admitting: Neurology

## 2023-12-25 ENCOUNTER — Telehealth: Payer: Self-pay | Admitting: Neurology

## 2023-12-25 VITALS — BP 138/87 | HR 74 | Ht 71.0 in | Wt 217.0 lb

## 2023-12-25 DIAGNOSIS — R413 Other amnesia: Secondary | ICD-10-CM | POA: Diagnosis not present

## 2023-12-25 DIAGNOSIS — Z820 Family history of epilepsy and other diseases of the nervous system: Secondary | ICD-10-CM | POA: Diagnosis not present

## 2023-12-25 DIAGNOSIS — G4733 Obstructive sleep apnea (adult) (pediatric): Secondary | ICD-10-CM | POA: Diagnosis not present

## 2023-12-25 NOTE — Telephone Encounter (Signed)
[employer] sup/medicare NPR sent to [facility] [phone]

## 2023-12-25 NOTE — Progress Notes (Signed)
 Provider:  Dedra Gores, MD  Primary Care Physician:  Loreli Elsie JONETTA Mickey., MD [address] [place] [address]     Referring Provider: Loreli Elsie JONETTA Mickey., Md [address] [place],  [place] [address]          Chief Complaint according to patient   Patient presents with:                HISTORY OF PRESENT ILLNESS:  ELIC VENCILL is a 73 y.o. male patient who is here for revisit 12/25/2023 for CPAP follow up and for a memory test. .  Chief concern according to patient :  fatigue !!  Patient  with history of prostate cancer, radiation, OSA on CPAP> excellent compliance on CPAP.SABRA Compliance 80% AHI 1.1/h  pressures between 5-15 cm with EPR. 9.7 cm at 95% pressure.     Not feeling like sleeping just general malaise. Worried a bit more:  Starting in March 2024 my daughter underwent a horrible, combative divorce and this weights on me, she is in [place].     Here for initial CPAP f/u and memory. Pt reports doing well on CPAP. taking sertraline for anxiety and doing well. Has been wearing hearing aide since Dec 20, 22. Pt is less concnerned today about memory. Does have family hx of AD. 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 He is of [place] descent, and has a history of sensory neural hearing loss with strong family history for hearing loss and  dementia. He voiced a concern about memory and we will repeat a MOCA test.  His new machine's CPAP compliance data are also to be reviewed.            He had a SPLIT study 2017 and was diagnosed with severe apnea, and a HST repeated in 2020 only found an AHI of 8/ h. His HTN is well controlled, he is complaint with CPAP and uses a nasal mask Airfit N 2. ResMed. He is due for new device.  He is not officially retired and is no longer new cases. He reports some anxiety recently. He is  more worried, more anxious , and always concerned worried. He has a touch of OCD. He feels that his cognitive function, alertness in mornings is not as good in AM.  He is worried about dementia, 2 siblings with dementia. Mother died of dementia,  father had no cognitive concerns.He spends a lot of time on [address]. Has sometimes vertigo- vestibular rehab and treatment worked for his vertigo, he had some impairment of peripheral vision. Impaired visual spatial sense.    He is willing to try an inspire , but this mid AHI would not justify a surgical procedure.               Review of Systems: Out of a complete 14 system review, the patient complains of only the following symptoms, and all other reviewed systems are negative.:  Fatigue, sleepiness , snoring, fragmented sleep, Insomnia, RLS, Nocturia   How likely are you to doze in the following situations: 0 = not likely, 1 = slight chance, 2 = moderate chance, 3 = high chance   Sitting and Reading? Watching Television? Sitting inactive in a public place (theater or meeting)? As a passenger in  a car for an hour without a break? Lying down in the afternoon when circumstances permit? Sitting and talking to someone? Sitting quietly after lunch without alcohol ? In a car, while stopped for a few minutes in traffic?   Total = 3/ 24 points   FSS endorsed at 36/ 63 points.   GDS : 3/ 15   Social History   Socioeconomic History   Marital status: Married    Spouse name: [name]   Number of children: 2   Years of education: Optician, Dispensing   Highest education level: Not on file  Occupational History   Not on file  Tobacco Use   Smoking status: Former    Types: Cigars   Smokeless tobacco: Never  Substance and Sexual Activity   Alcohol  use: Yes    Comment: 1-2 glasses of wine daily   Drug use: No   Sexual activity: Not on file  Other Topics Concern   Not on file  Social History Narrative   Patient is married ([name]) and lives at  home with his wife.   Patient has two adult children.   Patient has a Naval architect.   Patient is right-handed.   Patient drinks three cups of coffee daily.   Social Drivers of Corporate Investment Banker Strain: Not on file  Food Insecurity: Not on file  Transportation Needs: Not on file  Physical Activity: Not on file  Stress: Not on file  Social Connections: Not on file    Family History  Problem Relation Age of Onset   Lymphoma Father    Hypertension Father    Prostate cancer Father    Heart attack Father 6   Congestive Heart Failure Mother    Hypertension Mother     Past Medical History:  Diagnosis Date   BPH (benign prostatic hyperplasia)    Cancer (HCC)    Diastolic dysfunction    Dilated aortic root (HCC)    Fatigue    Hearing loss    History of basal cell cancer    History of echocardiogram    Echo 11/17: EF 55-60, normal wall motion, Gr 1 diastolic dysfunction, mildly dilated aortic root and ascending aorta (Ao root 40/ascending aorta 38), MAC   History of nuclear stress test    Myoview  11/17:  Ex 9'; no ischemia or infarct, HTN response to stress with peak BP 192/75; Low Risk   History of shingles    Hypertension    IFG (impaired fasting glucose)    Left rib fracture    Leg fracture, right    OSA on CPAP    Prostate cancer [facility])     Past Surgical History:  Procedure Laterality Date   removal of basal cell skin cancer     squamous cell carcinoma removal     TONSILLECTOMY AND ADENOIDECTOMY       Current Outpatient Medications on File Prior to Visit  Medication Sig Dispense Refill   atorvastatin (LIPITOR) 40 MG tablet Take 40 mg by mouth daily.      cetirizine (ZYRTEC) 10 MG tablet Take 10 mg by mouth daily as needed.      famotidine (PEPCID) 20 MG tablet take one tablet by mouth before meals     GUAIFENESIN PO Take 1 tablet by mouth at bedtime. 400 mg-600mg      valsartan  (DIOVAN ) 160 MG tablet Take 160 mg by mouth daily.      amoxicillin -clavulanate (AUGMENTIN ) 875-125 MG tablet Take 1 tablet by mouth every 12 (twelve) hours. (  Patient not taking: Reported on 12/25/2023) 14 tablet 0   cyanocobalamin  100 MCG tablet Take 100 mcg by mouth daily.     mupirocin  ointment (BACTROBAN ) 2 % Apply 1 Application topically 2 (two) times daily. (Patient not taking: Reported on 12/25/2023) 22 g 0   No current facility-administered medications on file prior to visit.    Allergies  Allergen Reactions   Efudex [Fluorouracil] Dermatitis    Rash, oozing   Ramipril     Other reaction(s): urinary symptoms   Sertraline Hives     DIAGNOSTIC DATA (LABS, IMAGING, TESTING) - I reviewed patient records, labs, notes, testing and imaging myself where available.  Lab Results  Component Value Date   WBC 5.1 10/18/2016   HGB 13.7 10/18/2016   HCT 40.2 10/18/2016   MCV 96.4 10/18/2016   PLT 282 10/18/2016      Component Value Date/Time   NA 139 10/18/2016 1239   K 4.8 10/18/2016 1239   CL 104 10/18/2016 1239   CO2 25 10/18/2016 1239   GLUCOSE 87 10/18/2016 1239   BUN 25 10/18/2016 1239   CREATININE 1.30 (H) 10/18/2016 1239   CALCIUM 9.4 10/18/2016 1239   No results found for: CHOL, HDL, LDLCALC, LDLDIRECT, TRIG, CHOLHDL No results found for: YHAJ8R No results found for: VITAMINB12 No results found for: TSH  PHYSICAL EXAM:  Today's Vitals   12/25/23 0937  BP: 138/87  Pulse: 74  Weight: 217 lb (98.4 kg)  Height: 5' 11 (1.803 m)   Body mass index is 30.27 kg/m.   Wt Readings from Last 3 Encounters:  12/25/23 217 lb (98.4 kg)  08/22/22 211 lb (95.7 kg)  03/20/22 217 lb 12.8 oz (98.8 kg)     Ht Readings from Last 3 Encounters:  12/25/23 5' 11 (1.803 m)  08/22/22 5' 11 (1.803 m)  03/20/22 5' 11 (1.803 m)      General: T The patient is awake, alert and appears not in acute distress. The patient is well groomed. Head: Normocephalic, atraumatic. Neck is supple. Mallampati 4 ,all natural teeth.    neck circumference: 17. 45. with a double chin.  Cardiovascular:  Regular rate and rhythm , without  murmurs or carotid bruit, and without distended neck veins. Respiratory: Lungs are clear to auscultation. Skin:  Without evidence of edema, or rash, rosacea . Trunk: BMI 30. 82 ,normal posture.   Neurologic exam : The patient is awake and alert, oriented to place and time. Memory subjective described as intact.  There is a normal attention span & concentration ability.      03/15/2022    2:45 PM  Montreal Cognitive Assessment   Visuospatial/ Executive (0/5) 5  Naming (0/3) 3  Attention: Read list of digits (0/2) 1  Attention: Read list of letters (0/1) 1  Attention: Serial 7 subtraction starting at 100 (0/3) 3  Language: Repeat phrase (0/2) 2  Language : Fluency (0/1) 1  Abstraction (0/2) 2  Delayed Recall (0/5) 2  Orientation (0/6) 6  Total 26      Cranial nerves:No change of smell or taste. Extraocular movements  in vertical and horizontal planes intact and without nystagmus. After vigorous head movements with closed eyes we note left sided nystagmus, 3 beats to the left. Posterior vestibular system on the left.    Visual fields by finger perimetry are intact. Hearing - uses hearing aid.   Facial motor strength is symmetric and tongue and uvula move midline. Finger-to-nose maneuver tested and normal without evidence of ataxia,  dysmetria or tremor. There is a symmetric muscle bulk, tone, full relaxation but I noticed a mild cogwheeling over both biceps, nothing at the wrist. Sensory exam is normal to vibration by tuning fork exam and fine touch, ankle medial and laterally are able to feel distinctive vibration from the instrument.  ASSESSMENT AND PLAN 73 y.o. year old male  here with:    1) fatigue with social stressors being high. Vit D, , B12  and daylight exposure, magnesium.   2) very good compliance. CPAP works well. HTN and cholesterol controlled.   3) no memory  testing here today- patient's choice.  We discussed ATN test today and will confirm  further work up for memory as needed, PET and MRI and amyloid studies.    I plan to follow up within 12 months for sleep, 4-6  months for memory. .   I would like to thank Loreli Elsie JONETTA Mickey., MD and Loreli Elsie JONETTA Mickey., Md [address] [place],  [place] [address] for allowing me to meet with and to take care of this pleasant patient.  .  After spending a total time of  35  minutes face to face and additional time for physical and neurologic examination, review of laboratory studies,  personal review of imaging studies, reports and results of other testing and review of referral information / records as far as provided in visit,   Electronically signed by: Dedra Gores, MD 12/25/2023 9:55 AM  [facility] Board certified by The [employer] of Sleep Medicine and Diplomate of the [employer] of Sleep Medicine. Board certified In Neurology through the ABPN, Fellow of the [employer] of Neu[employer].

## 2023-12-28 ENCOUNTER — Encounter: Payer: Self-pay | Admitting: Neurology

## 2023-12-31 NOTE — Telephone Encounter (Signed)
 Noted.

## 2024-01-02 ENCOUNTER — Encounter: Payer: Self-pay | Admitting: Neurology

## 2024-01-02 LAB — ATN PROFILE
A -- Beta-amyloid 42/40 Ratio: 0.129 (ref >0.102)
Beta-amyloid 40: 242.22 pg/mL
Beta-amyloid 42: 31.27 pg/mL
N -- NfL, Plasma: 3.08 pg/mL (ref 0.00–7.64)
T -- p-tau181: 0.89 pg/mL (ref 0.00–0.97)

## 2024-01-02 LAB — APOE ALZHEIMER'S RISK

## 2024-01-21 ENCOUNTER — Telehealth: Payer: Self-pay | Admitting: Neurology

## 2024-01-21 NOTE — Telephone Encounter (Signed)
LVM and sent mychart msg informing pt of need to reschedule 03/27/24 appt - MD out

## 2024-01-28 ENCOUNTER — Ambulatory Visit
Admission: RE | Admit: 2024-01-28 | Discharge: 2024-01-28 | Disposition: A | Discharge status: Home / Self Care | Payer: Medicare Other | Source: Ambulatory Visit | Attending: Neurology | Admitting: Neurology

## 2024-01-28 DIAGNOSIS — R413 Other amnesia: Secondary | ICD-10-CM | POA: Diagnosis not present

## 2024-01-28 DIAGNOSIS — G4733 Obstructive sleep apnea (adult) (pediatric): Secondary | ICD-10-CM | POA: Diagnosis not present

## 2024-01-28 DIAGNOSIS — Z820 Family history of epilepsy and other diseases of the nervous system: Secondary | ICD-10-CM

## 2024-01-28 MED ORDER — GADOPICLENOL 0.5 MMOL/ML IV SOLN
10.0000 mL | Freq: Once | INTRAVENOUS | Status: AC | PRN
  Administered 2024-01-28: 10 mL via INTRAVENOUS

## 2024-01-30 ENCOUNTER — Encounter: Payer: Self-pay | Admitting: Neurology

## 2024-02-08 ENCOUNTER — Other Ambulatory Visit: Payer: BLUE CROSS/BLUE SHIELD

## 2024-02-26 ENCOUNTER — Telehealth: Payer: Self-pay | Admitting: Internal Medicine

## 2024-02-26 NOTE — Telephone Encounter (Signed)
 Good afternoon Dr. Leonides Schanz  The following patient is requesting you to take over his care. He needs to have a colonoscopy and has been a patient of Dr. Kinnie Scales but unfortunately he has retired and he would like to see you considering his son is your patient. Records are available with care everywhere on epic. Please review and advise of scheduling. Thank you.

## 2024-02-27 NOTE — Telephone Encounter (Signed)
 I do not see any formal colonoscopy report, but I do see a path report for a colonoscopy in 06/2020 with benign but precancerous colon polyps (cecal 10 mm semi pedunculated SSA, hepatic flexure 5 mm sessile TA, and 3x sigmoid 6 mm semipedunculated TA and Hps) for which he was recommended for a repeat colonoscopy in 3 years. Okay to schedule for direct colonoscopy in the [facility].

## 2024-03-05 ENCOUNTER — Ambulatory Visit: Payer: Medicare Other | Admitting: Neurology

## 2024-03-11 ENCOUNTER — Encounter: Payer: Self-pay | Admitting: Internal Medicine

## 2024-03-27 ENCOUNTER — Ambulatory Visit: Payer: Medicare Other | Admitting: Neurology

## 2024-04-07 ENCOUNTER — Encounter: Payer: Self-pay | Admitting: Neurology

## 2024-04-07 ENCOUNTER — Ambulatory Visit: Payer: Medicare Other | Admitting: Neurology

## 2024-04-07 ENCOUNTER — Ambulatory Visit (INDEPENDENT_AMBULATORY_CARE_PROVIDER_SITE_OTHER): Admitting: Neurology

## 2024-04-07 VITALS — BP 142/75 | HR 79 | Ht 71.0 in | Wt 218.0 lb

## 2024-04-07 DIAGNOSIS — R5383 Other fatigue: Secondary | ICD-10-CM
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
## 2024-04-08 ENCOUNTER — Encounter: Payer: Self-pay | Admitting: Neurology

## 2024-04-11 LAB — MULTIPLE MYELOMA PANEL, SERUM
Albumin SerPl Elph-Mcnc: 4 g/dL (ref 2.9–4.4)
Albumin/Glob SerPl: 1.7 (ref 0.7–1.7)
Alpha 1: 0.2 g/dL (ref 0.0–0.4)
Alpha2 Glob SerPl Elph-Mcnc: 0.7 g/dL (ref 0.4–1.0)
B-Globulin SerPl Elph-Mcnc: 0.9 g/dL (ref 0.7–1.3)
Gamma Glob SerPl Elph-Mcnc: 0.6 g/dL (ref 0.4–1.8)
Globulin, Total: 2.5 g/dL (ref 2.2–3.9)
IgA/Immunoglobulin A, Serum: 140 mg/dL (ref 61–437)
IgG (Immunoglobin G), Serum: 681 mg/dL (ref 603–1613)
IgM (Immunoglobulin M), Srm: 121 mg/dL (ref 15–143)

## 2024-04-11 LAB — COMPREHENSIVE METABOLIC PANEL WITH GFR
ALT: 20 IU/L (ref 0–44)
AST: 16 IU/L (ref 0–40)
Albumin: 4.5 g/dL (ref 3.8–4.8)
Alkaline Phosphatase: 84 IU/L (ref 44–121)
BUN/Creatinine Ratio: 23 (ref 10–24)
BUN: 29 mg/dL — ABNORMAL HIGH (ref 8–27)
Bilirubin Total: 0.5 mg/dL (ref 0.0–1.2)
CO2: 21 mmol/L (ref 20–29)
Calcium: 9.3 mg/dL (ref 8.6–10.2)
Chloride: 105 mmol/L (ref 96–106)
Creatinine, Ser: 1.28 mg/dL — ABNORMAL HIGH (ref 0.76–1.27)
Globulin, Total: 2 g/dL (ref 1.5–4.5)
Glucose: 99 mg/dL (ref 70–99)
Potassium: 4.8 mmol/L (ref 3.5–5.2)
Sodium: 139 mmol/L (ref 134–144)
Total Protein: 6.5 g/dL (ref 6.0–8.5)
eGFR: 58 mL/min/{1.73_m2} — ABNORMAL LOW (ref >59)

## 2024-04-11 LAB — CBC WITH DIFFERENTIAL/PLATELET
Basophils Absolute: 0 10*3/uL (ref 0.0–0.2)
Basos: 1 %
EOS (ABSOLUTE): 0.1 10*3/uL (ref 0.0–0.4)
Eos: 3 %
Hematocrit: 39.5 % (ref 37.5–51.0)
Hemoglobin: 13.3 g/dL (ref 13.0–17.7)
Immature Grans (Abs): 0 10*3/uL (ref 0.0–0.1)
Immature Granulocytes: 0 %
Lymphocytes Absolute: 1.4 10*3/uL (ref 0.7–3.1)
Lymphs: 29 %
MCH: 32.8 pg (ref 26.6–33.0)
MCHC: 33.7 g/dL (ref 31.5–35.7)
MCV: 97 fL (ref 79–97)
Monocytes Absolute: 0.4 10*3/uL (ref 0.1–0.9)
Monocytes: 9 %
Neutrophils Absolute: 2.8 10*3/uL (ref 1.4–7.0)
Neutrophils: 58 %
Platelets: 277 10*3/uL (ref 150–450)
RBC: 4.06 x10E6/uL — ABNORMAL LOW (ref 4.14–5.80)
RDW: 11.8 % (ref 11.6–15.4)
WBC: 4.8 10*3/uL (ref 3.4–10.8)

## 2024-04-11 LAB — SJOGREN'S SYNDROME ANTIBODS(SSA + SSB)
ENA SSA (RO) Ab: 0.2 AI (ref 0.0–0.9)
ENA SSB (LA) Ab: <0.2 AI (ref 0.0–0.9)

## 2024-04-11 LAB — HEPATITIS B SURFACE ANTIBODY,QUALITATIVE: Hep B Surface Ab, Qual: NONREACTIVE

## 2024-04-11 LAB — HEPATITIS B CORE ANTIBODY, TOTAL: Hep B Core Total Ab: NEGATIVE

## 2024-04-11 LAB — C-REACTIVE PROTEIN: CRP: <1 mg/L (ref 0–10)

## 2024-04-11 LAB — ANA W/REFLEX

## 2024-04-11 LAB — SEDIMENTATION RATE: Sed Rate: 6 mm/h (ref 0–30)

## 2024-04-11 LAB — VITAMIN B12: Vitamin B-12: 731 pg/mL (ref 232–1245)

## 2024-04-11 LAB — TSH: TSH: 2.6 u[IU]/mL (ref 0.450–4.500)

## 2024-04-15 MED ORDER — ZALEPLON 5 MG PO CAPS: 5.0000 mg | ORAL_CAPSULE | Freq: Every evening | ORAL | 0 refills | Status: DC | PRN

## 2024-04-15 MED ORDER — BELSOMRA 15 MG PO TABS: 15.0000 mg | ORAL_TABLET | Freq: Every evening | ORAL | 1 refills | Status: DC | PRN

## 2024-04-15 NOTE — Patient Instructions (Signed)
 [email or web address].[email or web address]-shuffling.[email or web address]

## 2024-04-17 ENCOUNTER — Encounter

## 2024-04-28 ENCOUNTER — Encounter

## 2024-05-05 ENCOUNTER — Ambulatory Visit: Payer: Self-pay | Admitting: Neurology

## 2024-05-06 ENCOUNTER — Encounter: Admitting: Internal Medicine

## 2024-06-03 ENCOUNTER — Encounter: Admitting: Internal Medicine

## 2024-06-06 ENCOUNTER — Other Ambulatory Visit: Payer: Self-pay | Admitting: Neurology

## 2024-06-11 ENCOUNTER — Ambulatory Visit (AMBULATORY_SURGERY_CENTER)

## 2024-06-11 VITALS — Ht 71.0 in | Wt 212.0 lb

## 2024-06-11 DIAGNOSIS — Z860100 Personal history of colon polyps, unspecified: Secondary | ICD-10-CM

## 2024-06-11 MED ORDER — NA SULFATE-K SULFATE-MG SULF 17.5-3.13-1.6 GM/177ML PO SOLN
1.0000 | Freq: Once | ORAL | 0 refills | Status: AC
Start: 2024-06-11 — End: 2024-06-11

## 2024-06-11 NOTE — Progress Notes (Signed)

## 2024-06-12 ENCOUNTER — Telehealth: Payer: Self-pay | Admitting: Internal Medicine

## 2024-06-12 NOTE — Telephone Encounter (Signed)
 Patient called and stated that he would like to make sure that his colonoscopy procedure is going to be cover with medicare before his procedure on July the 18 th. Patient is requesting a call back. Please advise.

## 2024-06-17 DIAGNOSIS — H5213 Myopia, bilateral: Secondary | ICD-10-CM | POA: Diagnosis not present

## 2024-06-17 DIAGNOSIS — H35373 Puckering of macula, bilateral: Secondary | ICD-10-CM | POA: Diagnosis not present

## 2024-06-17 DIAGNOSIS — H2513 Age-related nuclear cataract, bilateral: Secondary | ICD-10-CM | POA: Diagnosis not present

## 2024-06-25 ENCOUNTER — Ambulatory Visit: Admitting: Neurology

## 2024-06-30 ENCOUNTER — Encounter: Payer: Self-pay | Admitting: Neurology

## 2024-07-04 ENCOUNTER — Encounter: Payer: Self-pay | Admitting: Internal Medicine

## 2024-07-04 ENCOUNTER — Ambulatory Visit: Admitting: Internal Medicine

## 2024-07-04 VITALS — BP 118/61 | HR 59 | Temp 97.3°F | Resp 10 | Ht 71.0 in | Wt 212.0 lb

## 2024-07-04 DIAGNOSIS — D123 Benign neoplasm of transverse colon: Secondary | ICD-10-CM | POA: Diagnosis not present

## 2024-07-04 DIAGNOSIS — D120 Benign neoplasm of cecum: Secondary | ICD-10-CM

## 2024-07-04 DIAGNOSIS — Z1211 Encounter for screening for malignant neoplasm of colon: Secondary | ICD-10-CM | POA: Diagnosis not present

## 2024-07-04 DIAGNOSIS — K5730 Diverticulosis of large intestine without perforation or abscess without bleeding: Secondary | ICD-10-CM

## 2024-07-04 DIAGNOSIS — I10 Essential (primary) hypertension: Secondary | ICD-10-CM | POA: Diagnosis not present

## 2024-07-04 DIAGNOSIS — Z860100 Personal history of colon polyps, unspecified: Secondary | ICD-10-CM | POA: Diagnosis not present

## 2024-07-04 DIAGNOSIS — D125 Benign neoplasm of sigmoid colon: Secondary | ICD-10-CM

## 2024-07-04 DIAGNOSIS — G4733 Obstructive sleep apnea (adult) (pediatric): Secondary | ICD-10-CM | POA: Diagnosis not present

## 2024-07-04 DIAGNOSIS — K635 Polyp of colon: Secondary | ICD-10-CM | POA: Diagnosis not present

## 2024-07-04 DIAGNOSIS — K648 Other hemorrhoids: Secondary | ICD-10-CM

## 2024-07-04 DIAGNOSIS — D122 Benign neoplasm of ascending colon: Secondary | ICD-10-CM | POA: Diagnosis not present

## 2024-07-04 MED ORDER — SODIUM CHLORIDE 0.9 % IV SOLN: 500.0000 mL | INTRAVENOUS | Status: DC

## 2024-07-04 NOTE — Patient Instructions (Addendum)
 Await pathology results  Please continue your normal medications Please read over handouts provided   YOU HAD AN ENDOSCOPIC PROCEDURE TODAY AT THE [facility]:   Refer to the procedure report that was given to you for any specific questions about what was found during the examination.  If the procedure report does not answer your questions, please call your gastroenterologist to clarify.  If you requested that your care partner not be given the details of your procedure findings, then the procedure report has been included in a sealed envelope for you to review at your convenience later.  YOU SHOULD EXPECT: Some feelings of bloating in the abdomen. Passage of more gas than usual.  Walking can help get rid of the air that was put into your GI tract during the procedure and reduce the bloating. If you had a lower endoscopy (such as a colonoscopy or flexible sigmoidoscopy) you may notice spotting of blood in your stool or on the toilet paper. If you underwent a bowel prep for your procedure, you may not have a normal bowel movement for a few days.  Please Note:  You might notice some irritation and congestion in your nose or some drainage.  This is from the oxygen used during your procedure.  There is no need for concern and it should clear up in a day or so.  SYMPTOMS TO REPORT IMMEDIATELY:  Following lower endoscopy (colonoscopy or flexible sigmoidoscopy):  Excessive amounts of blood in the stool  Significant tenderness or worsening of abdominal pains  Swelling of the abdomen that is new, acute  Fever of 100F or higher For urgent or emergent issues, a gastroenterologist can be reached at any hour by calling [phone]. Do not use MyChart messaging for urgent concerns.    DIET:  We do recommend a small meal at first, but then you may proceed to your regular diet.  Drink plenty of fluids but you should avoid alcoholic beverages for 24 hours.  ACTIVITY:  You should plan to take  it easy for the rest of today and you should NOT DRIVE or use heavy machinery until tomorrow (because of the sedation medicines used during the test).    FOLLOW UP: Our staff will call the number listed on your records the next business day following your procedure.  We will call around 7:15- 8:00 am to check on you and address any questions or concerns that you may have regarding the information given to you following your procedure. If we do not reach you, we will leave a message.     If any biopsies were taken you will be contacted by phone or by letter within the next 1-3 weeks.  Please call us  at [phone] if you have not heard about the biopsies in 3 weeks.    SIGNATURES/CONFIDENTIALITY: You and/or your care partner have signed paperwork which will be entered into your electronic medical record.  These signatures attest to the fact that that the information above on your After Visit Summary has been reviewed and is understood.  Full responsibility of the confidentiality of this discharge information lies with you and/or your care-partner.

## 2024-07-04 NOTE — Progress Notes (Signed)
 Pt sedate, gd SR's, VSS, report to RN

## 2024-07-04 NOTE — Op Note (Signed)
 [facility] Patient Name: Jeremiah Holt Procedure Date: 07/04/2024 9:11 AM MRN: [id number] Endoscopist: Rosario Estefana Kidney , , [id number] Age: 72 Referring MD:  Date of Birth: February 21, 1951 Gender: Male Account #: [id number] Procedure:                Colonoscopy Indications:              High risk colon cancer surveillance: Personal                            history of colonic polyps Medicines:                Monitored Anesthesia Care Procedure:                Pre-Anesthesia Assessment:                           - Prior to the procedure, a History and Physical                            was performed, and patient medications and                            allergies were reviewed. The patient's tolerance of                            previous anesthesia was also reviewed. The risks                            and benefits of the procedure and the sedation                            options and risks were discussed with the patient.                            All questions were answered, and informed consent                            was obtained. Prior Anticoagulants: The patient has                            taken no anticoagulant or antiplatelet agents. ASA                            Grade Assessment: II - A patient with mild systemic                            disease. After reviewing the risks and benefits,                            the patient was deemed in satisfactory condition to                            undergo the procedure.  After obtaining informed consent, the colonoscope                            was passed under direct vision. Throughout the                            procedure, the patient's blood pressure, pulse, and                            oxygen saturations were monitored continuously. The                            Olympus Scope SN: [id number] was introduced through                            the anus and advanced to the the  terminal ileum.                            The colonoscopy was performed without difficulty.                            The patient tolerated the procedure well. The                            quality of the bowel preparation was good. The                            terminal ileum, ileocecal valve, appendiceal                            orifice, and rectum were photographed. Scope In: 9:28:56 AM Scope Out: 9:55:51 AM Scope Withdrawal Time: 0 hours 23 minutes 42 seconds  Total Procedure Duration: 0 hours 26 minutes 55 seconds  Findings:                 The terminal ileum appeared normal.                           Three sessile polyps were found in the transverse                            colon, ascending colon and cecum. The polyps were 3                            to 10 mm in size. These polyps were removed with a                            cold snare. Resection and retrieval were complete.                           Multiple diverticula were found in the sigmoid                            colon and descending colon.  A 3 mm polyp was found in the sigmoid colon. The                            polyp was sessile. The polyp was removed with a                            cold snare. Resection and retrieval were complete.                           Non-bleeding internal hemorrhoids were found during                            retroflexion. Complications:            No immediate complications. Estimated Blood Loss:     Estimated blood loss was minimal. Impression:               - The examined portion of the ileum was normal.                           - Three 3 to 10 mm polyps in the transverse colon,                            in the ascending colon and in the cecum, removed                            with a cold snare. Resected and retrieved.                           - Diverticulosis in the sigmoid colon and in the                            descending colon.                            - One 3 mm polyp in the sigmoid colon, removed with                            a cold snare. Resected and retrieved.                           - Non-bleeding internal hemorrhoids. Recommendation:           - Discharge patient to home (with escort).                           - Await pathology results.                           - The findings and recommendations were discussed                            with the patient. Dr [name] Rosario [name],  07/04/2024 9:59:46 AM

## 2024-07-04 NOTE — Progress Notes (Signed)
 GASTROENTEROLOGY PROCEDURE H&P NOTE   Primary Care Physician: Loreli Elsie JONETTA Mickey., MD    Reason for Procedure:   History of colon polyps  Plan:    Colonoscopy  Patient is appropriate for endoscopic procedure(s) in the ambulatory ([facility]) setting.  The nature of the procedure, as well as the risks, benefits, and alternatives were carefully and thoroughly reviewed with the patient. Ample time for discussion and questions allowed. The patient understood, was satisfied, and agreed to proceed.     HPI: Jeremiah Holt is a 73 y.o. male who presents for colonoscopy for history of colon polyps. No formal colonoscopy report, but path report for a colonoscopy in 06/2020 showed benign but precancerous colon polyps (cecal 10 mm semi pedunculated SSA, hepatic flexure 5 mm sessile TA, and 3x sigmoid 6 mm semipedunculated TA and Hps) for which he was recommended for a repeat colonoscopy in 3 years.  Past Medical History:  Diagnosis Date   BPH (benign prostatic hyperplasia)    Cancer (HCC)    Diastolic dysfunction    Dilated aortic root (HCC)    Fatigue    Hearing loss    History of basal cell cancer    History of echocardiogram    Echo 11/17: EF 55-60, normal wall motion, Gr 1 diastolic dysfunction, mildly dilated aortic root and ascending aorta (Ao root 40/ascending aorta 38), MAC   History of nuclear stress test    Myoview  11/17:  Ex 9'; no ischemia or infarct, HTN response to stress with peak BP 192/75; Low Risk   History of shingles    Hypertension    IFG (impaired fasting glucose)    Left rib fracture    Leg fracture, right    OSA on CPAP    Prostate cancer (HCC)     Past Surgical History:  Procedure Laterality Date   removal of basal cell skin cancer     squamous cell carcinoma removal     TONSILLECTOMY AND ADENOIDECTOMY      Prior to Admission medications   Medication Sig Start Date End Date Taking? Authorizing Provider  atorvastatin (LIPITOR) 40 MG tablet Take 40 mg by  mouth daily.  12/16/13   [provider]  cetirizine (ZYRTEC) 10 MG tablet Take 10 mg by mouth daily as needed.     [provider]  famotidine (PEPCID) 20 MG tablet Take 20 mg by mouth daily.    [provider]  GUAIFENESIN PO Take 1 tablet by mouth at bedtime. 400 mg-600mg     [provider]  valsartan  (DIOVAN ) 160 MG tablet Take 160 mg by mouth daily.    [provider]  zaleplon  (SONATA ) 5 MG capsule Take 1 capsule (5 mg total) by mouth at bedtime as needed for sleep. 06/19/24   Onita Duos, MD    Current Outpatient Medications  Medication Sig Dispense Refill   atorvastatin (LIPITOR) 40 MG tablet Take 40 mg by mouth daily.      cetirizine (ZYRTEC) 10 MG tablet Take 10 mg by mouth daily as needed.      famotidine (PEPCID) 20 MG tablet Take 20 mg by mouth daily.     GUAIFENESIN PO Take 1 tablet by mouth at bedtime. 400 mg-600mg      valsartan  (DIOVAN ) 160 MG tablet Take 160 mg by mouth daily.     zaleplon  (SONATA ) 5 MG capsule Take 1 capsule (5 mg total) by mouth at bedtime as needed for sleep. 30 capsule 0   Current Facility-Administered Medications  Medication Dose Route Frequency Provider Last Rate Last Admin   0.9 %  sodium chloride infusion  500 mL Intravenous Continuous Federico Rosario BROCKS, MD        Allergies as of 07/04/2024 - Review Complete 06/11/2024  Allergen Reaction Noted   Efudex [fluorouracil] Dermatitis 10/26/2014   Pollen extract Other (See Comments) 12/21/2022   Sertraline Hives 03/18/2022   Ramipril Dermatitis 12/19/2021    Family History  Problem Relation Age of Onset   Congestive Heart Failure Mother    Hypertension Mother    Lymphoma Father    Hypertension Father    Prostate cancer Father    Heart attack Father 88   Colon cancer Neg Hx    Rectal cancer Neg Hx    Stomach cancer Neg Hx     Social History   Socioeconomic History   Marital status: Married    Spouse name: Jeremiah Holt   Number of children: 2   Years of  education: Grad [employer]   Highest education level: Not on file  Occupational History   Not on file  Tobacco Use   Smoking status: Former    Types: Cigars   Smokeless tobacco: Never  Substance and Sexual Activity   Alcohol  use: Yes    Comment: 1-2 glasses of wine daily   Drug use: No   Sexual activity: Not on file  Other Topics Concern   Not on file  Social History Narrative   Patient is married (Jeremiah Holt) and lives at home with his wife.   Patient has two adult children.   Patient has a Naval architect.   Patient is right-handed.   Patient drinks three cups of coffee daily.   Social Drivers of Corporate investment banker Strain: Not on file  Food Insecurity: Not on file  Transportation Needs: Not on file  Physical Activity: Not on file  Stress: Not on file  Social Connections: Not on file  Intimate Partner Violence: Not on file    Physical Exam: Vital signs in last 24 hours: BP 139/76   Pulse 89   Temp (!) 97.3 F (36.3 C)   Ht 5' 11 (1.803 m)   Wt 212 lb (96.2 kg)   SpO2 97%   BMI 29.57 kg/m  GEN: NAD EYE: Sclerae anicteric ENT: MMM CV: Non-tachycardic Pulm: No increased work of breathing GI: Soft, NT/ND NEURO:  Alert & Oriented   [name], MD [facility]  07/04/2024 8:50 AM

## 2024-07-07 NOTE — Telephone Encounter (Signed)
 Attempted f/u call. No answer, left VM.

## 2024-07-08 ENCOUNTER — Ambulatory Visit: Payer: Self-pay | Admitting: Internal Medicine

## 2024-07-08 LAB — SURGICAL PATHOLOGY

## 2024-07-10 DIAGNOSIS — L570 Actinic keratosis: Secondary | ICD-10-CM | POA: Diagnosis not present

## 2024-07-10 DIAGNOSIS — D485 Neoplasm of uncertain behavior of skin: Secondary | ICD-10-CM | POA: Diagnosis not present

## 2024-07-10 DIAGNOSIS — D225 Melanocytic nevi of trunk: Secondary | ICD-10-CM | POA: Diagnosis not present

## 2024-07-10 DIAGNOSIS — L821 Other seborrheic keratosis: Secondary | ICD-10-CM | POA: Diagnosis not present

## 2024-07-19 DIAGNOSIS — L03011 Cellulitis of right finger: Secondary | ICD-10-CM | POA: Diagnosis not present

## 2024-07-24 DIAGNOSIS — D485 Neoplasm of uncertain behavior of skin: Secondary | ICD-10-CM | POA: Diagnosis not present

## 2024-07-24 DIAGNOSIS — L570 Actinic keratosis: Secondary | ICD-10-CM | POA: Diagnosis not present

## 2024-07-24 DIAGNOSIS — D034 Melanoma in situ of scalp and neck: Secondary | ICD-10-CM | POA: Diagnosis not present

## 2024-08-01 ENCOUNTER — Encounter: Payer: Self-pay | Admitting: Neurology

## 2024-08-01 DIAGNOSIS — R5383 Other fatigue: Secondary | ICD-10-CM

## 2024-08-01 DIAGNOSIS — R413 Other amnesia: Secondary | ICD-10-CM

## 2024-08-01 DIAGNOSIS — G4733 Obstructive sleep apnea (adult) (pediatric): Secondary | ICD-10-CM

## 2024-08-05 ENCOUNTER — Other Ambulatory Visit: Payer: Self-pay

## 2024-08-05 MED ORDER — ZALEPLON 5 MG PO CAPS: 5.0000 mg | ORAL_CAPSULE | Freq: Every evening | ORAL | 0 refills | Status: AC | PRN

## 2024-08-05 NOTE — Telephone Encounter (Signed)
 Requested Prescriptions   Pending Prescriptions Disp Refills   zaleplon  (SONATA ) 5 MG capsule 30 capsule 0    Sig: Take 1 capsule (5 mg total) by mouth at bedtime as needed for sleep.   Last seen 04/07/24 Next appt not scheduled  Dispenses  No dispenses within 180 days of the adherence period (since 10/19/2023)   How do dispenses affect the score?    Outpatient Orders     The patient is taking this medication long-term.

## 2024-08-26 ENCOUNTER — Encounter

## 2024-08-26 DIAGNOSIS — L905 Scar conditions and fibrosis of skin: Secondary | ICD-10-CM | POA: Diagnosis not present

## 2024-08-26 DIAGNOSIS — D0339 Melanoma in situ of other parts of face: Secondary | ICD-10-CM | POA: Diagnosis not present

## 2024-09-02 ENCOUNTER — Ambulatory Visit (INDEPENDENT_AMBULATORY_CARE_PROVIDER_SITE_OTHER): Admitting: Neurology

## 2024-09-02 DIAGNOSIS — G4733 Obstructive sleep apnea (adult) (pediatric): Secondary | ICD-10-CM | POA: Diagnosis not present

## 2024-09-02 DIAGNOSIS — R413 Other amnesia: Secondary | ICD-10-CM

## 2024-09-02 DIAGNOSIS — R5383 Other fatigue: Secondary | ICD-10-CM
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
## 2024-09-10 ENCOUNTER — Ambulatory Visit: Payer: Self-pay | Admitting: Neurology

## 2024-09-10 DIAGNOSIS — R5383 Other fatigue: Secondary | ICD-10-CM | POA: Insufficient documentation
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
## 2024-10-03 DIAGNOSIS — Z23 Encounter for immunization: Secondary | ICD-10-CM | POA: Diagnosis not present

## 2024-10-04 DIAGNOSIS — M791 Myalgia, unspecified site: Secondary | ICD-10-CM | POA: Diagnosis not present

## 2024-10-08 DIAGNOSIS — L923 Foreign body granuloma of the skin and subcutaneous tissue: Secondary | ICD-10-CM | POA: Diagnosis not present

## 2024-10-20 DIAGNOSIS — Z23 Encounter for immunization: Secondary | ICD-10-CM | POA: Diagnosis not present

## 2025-01-18 ENCOUNTER — Encounter: Payer: Self-pay | Admitting: Neurology
